# Patient Record
Sex: Male | Born: 1980 | Race: Black or African American | Hispanic: No | Marital: Single | Smoking: Never smoker
Health system: Southern US, Community
[De-identification: ages and names within clinical notes are randomized; demographics above are authoritative.]

## PROBLEM LIST (undated history)

## (undated) DIAGNOSIS — K611 Rectal abscess: Secondary | ICD-10-CM

## (undated) HISTORY — PX: HERNIA REPAIR: SHX51

---
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
## 2002-01-21 ENCOUNTER — Encounter: Admission: RE | Admit: 2002-01-21 | Discharge: 2002-01-21 | Payer: Self-pay | Admitting: Psychiatry

## 2002-03-11 ENCOUNTER — Encounter: Admission: RE | Admit: 2002-03-11 | Discharge: 2002-03-11 | Payer: Self-pay | Admitting: Psychiatry

## 2002-04-22 ENCOUNTER — Encounter: Admission: RE | Admit: 2002-04-22 | Discharge: 2002-04-22 | Payer: Self-pay | Admitting: Psychiatry

## 2002-06-02 ENCOUNTER — Encounter: Admission: RE | Admit: 2002-06-02 | Discharge: 2002-06-02 | Payer: Self-pay | Admitting: Psychiatry

## 2002-07-14 ENCOUNTER — Encounter: Admission: RE | Admit: 2002-07-14 | Discharge: 2002-07-14 | Payer: Self-pay | Admitting: Psychiatry

## 2002-08-27 ENCOUNTER — Encounter: Admission: RE | Admit: 2002-08-27 | Discharge: 2002-08-27 | Payer: Self-pay | Admitting: Psychiatry

## 2002-12-07 ENCOUNTER — Emergency Department (HOSPITAL_COMMUNITY): Admission: AD | Admit: 2002-12-07 | Discharge: 2002-12-07 | Payer: Self-pay | Admitting: Emergency Medicine

## 2002-12-08 ENCOUNTER — Encounter: Payer: Self-pay | Admitting: Emergency Medicine

## 2004-01-01 ENCOUNTER — Ambulatory Visit (HOSPITAL_COMMUNITY): Payer: Self-pay | Admitting: Psychiatry

## 2004-04-04 ENCOUNTER — Ambulatory Visit (HOSPITAL_COMMUNITY): Payer: Self-pay | Admitting: Psychiatry

## 2004-07-04 ENCOUNTER — Ambulatory Visit (HOSPITAL_COMMUNITY): Payer: Self-pay | Admitting: Psychiatry

## 2004-10-03 ENCOUNTER — Ambulatory Visit (HOSPITAL_COMMUNITY): Payer: Self-pay | Admitting: Psychiatry

## 2005-02-15 ENCOUNTER — Ambulatory Visit (HOSPITAL_COMMUNITY): Payer: Self-pay | Admitting: Psychiatry

## 2005-05-15 ENCOUNTER — Ambulatory Visit (HOSPITAL_COMMUNITY): Payer: Self-pay | Admitting: Psychiatry

## 2005-09-20 ENCOUNTER — Ambulatory Visit (HOSPITAL_COMMUNITY): Payer: Self-pay | Admitting: Psychiatry

## 2005-10-25 ENCOUNTER — Ambulatory Visit: Payer: Self-pay | Admitting: Family Medicine

## 2005-12-18 ENCOUNTER — Ambulatory Visit (HOSPITAL_COMMUNITY): Payer: Self-pay | Admitting: Psychiatry

## 2006-04-04 ENCOUNTER — Ambulatory Visit (HOSPITAL_COMMUNITY): Payer: Self-pay | Admitting: Psychiatry

## 2006-07-11 ENCOUNTER — Ambulatory Visit (HOSPITAL_COMMUNITY): Payer: Self-pay | Admitting: Psychiatry

## 2006-10-19 ENCOUNTER — Ambulatory Visit (HOSPITAL_COMMUNITY): Payer: Self-pay | Admitting: Psychiatry

## 2007-01-18 ENCOUNTER — Ambulatory Visit (HOSPITAL_COMMUNITY): Payer: Self-pay | Admitting: Psychiatry

## 2007-08-13 ENCOUNTER — Emergency Department (HOSPITAL_COMMUNITY): Admission: EM | Admit: 2007-08-13 | Discharge: 2007-08-13 | Payer: Self-pay | Admitting: Emergency Medicine

## 2007-08-14 ENCOUNTER — Telehealth (INDEPENDENT_AMBULATORY_CARE_PROVIDER_SITE_OTHER): Payer: Self-pay | Admitting: *Deleted

## 2008-08-27 ENCOUNTER — Encounter (INDEPENDENT_AMBULATORY_CARE_PROVIDER_SITE_OTHER): Payer: Self-pay | Admitting: Family Medicine

## 2010-11-24 LAB — HEMOGLOBIN AND HEMATOCRIT, BLOOD
HCT: 43
Hemoglobin: 15

## 2010-11-24 LAB — OCCULT BLOOD X 1 CARD TO LAB, STOOL: Fecal Occult Bld: NEGATIVE

## 2011-02-12 ENCOUNTER — Encounter: Payer: Self-pay | Admitting: Emergency Medicine

## 2011-02-12 ENCOUNTER — Emergency Department (HOSPITAL_COMMUNITY)
Admission: EM | Admit: 2011-02-12 | Discharge: 2011-02-12 | Disposition: A | Discharge status: Home / Self Care | Payer: Medicaid Other | Attending: Emergency Medicine | Admitting: Emergency Medicine

## 2011-02-12 DIAGNOSIS — L0231 Cutaneous abscess of buttock: Secondary | ICD-10-CM | POA: Insufficient documentation

## 2011-02-12 DIAGNOSIS — L0390 Cellulitis, unspecified: Secondary | ICD-10-CM

## 2011-02-12 DIAGNOSIS — K6289 Other specified diseases of anus and rectum: Secondary | ICD-10-CM | POA: Insufficient documentation

## 2011-02-12 DIAGNOSIS — L03317 Cellulitis of buttock: Secondary | ICD-10-CM | POA: Insufficient documentation

## 2011-02-12 DIAGNOSIS — IMO0001 Reserved for inherently not codable concepts without codable children: Secondary | ICD-10-CM | POA: Insufficient documentation

## 2011-02-12 MED ORDER — IBUPROFEN 800 MG PO TABS: 800.0000 mg | ORAL_TABLET | Freq: Three times a day (TID) | ORAL | Status: AC

## 2011-02-12 MED ORDER — HYDROMORPHONE HCL PF 2 MG/ML IJ SOLN
2.0000 mg | Freq: Once | INTRAMUSCULAR | Status: AC
  Administered 2011-02-12: 2 mg via INTRAMUSCULAR
  Filled 2011-02-12: qty 1

## 2011-02-12 MED ORDER — SULFAMETHOXAZOLE-TRIMETHOPRIM 800-160 MG PO TABS: 2.0000 | ORAL_TABLET | Freq: Two times a day (BID) | ORAL | Status: AC

## 2011-02-12 NOTE — ED Notes (Signed)
Pt here for swelling to left buttock and swollen area to rectum x 1 week; pt sts thought was hemorrhoid but has not improved

## 2011-02-12 NOTE — ED Notes (Signed)
Patient states that he had onset of pain to his left buttocks and rectal area x 1 week.  Pt states that he thought it was hemrroids, but now thinks it may be a sore.  Patient denies any constipation at this time.  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  Pt states that he now has drainage from area.  Pt states he thinks he has had a fever with same.  Patient has increase in pain when he sits or walks.  Patient is undressing for further examination per MD.

## 2011-02-12 NOTE — ED Provider Notes (Signed)
History     CSN: [id number] Arrival date & time: 02/12/2011  9:11 AM   First MD Initiated Contact with Patient 02/12/11 1014      Chief Complaint  Patient presents with  . Rectal Pain    (Consider location/radiation/quality/duration/timing/severity/associated sxs/prior treatment) HPI Comments: Patient reports pain and swelling in his left buttock x 6 days.  Was using antibiotic cream at first then switched to hemorrhoid cream.  Denies bleeding or discharge.  Denies fevers, abdominal pain, change in bowel habits.    The history is provided by the patient.    History reviewed. No pertinent past medical history.  History reviewed. No pertinent past surgical history.  History reviewed. No pertinent family history.  History  Substance Use Topics  . Smoking status: Never Smoker   . Smokeless tobacco: Not on file  . Alcohol Use: No      Review of Systems  All other systems reviewed and are negative.    Allergies  Review of patient's allergies indicates no known allergies.  Home Medications   Current Outpatient Rx  Name Route Sig Dispense Refill  . PSYLLIUM 95 % PO PACK Oral Take 1 packet by mouth daily.        BP 119/71  Pulse 108  Temp(Src) 98 F (36.7 C) (Oral)  Resp 18  SpO2 99%  Physical Exam  Nursing note and vitals reviewed. Constitutional: He is oriented to person, place, and time. He appears well-developed and well-nourished.  HENT:  Head: Normocephalic and atraumatic.  Neck: Neck supple.  Pulmonary/Chest: Effort normal.  Neurological: He is alert and oriented to person, place, and time.  Skin:       ED Course  Procedures (including critical care time)  Labs Reviewed - No data to display No results found. INCISION AND DRAINAGE Performed by: Rise Patience Consent: Verbal consent obtained. Risks and benefits: risks, benefits and alternatives were discussed Type: abscess  Body area: left buttock   Anesthesia: local infiltration  Local  anesthetic: lidocaine 2% no epinephrine  Anesthetic total: 6 ml  Complexity: complex Blunt dissection to break up loculations  Drainage: purulent  Drainage amount: large  Packing material: 1/4 in iodoform gauze  Patient tolerance: Patient tolerated the procedure well with no immediate complications.     1. Abscess and cellulitis    Medical screening examination/treatment/procedure(s) were performed by non-physician practitioner and as supervising physician I was immediately available for consultation/collaboration. Osvaldo Human, M.D.   MDM  Patient with abscess and surrounding cellulitis, I&D performed in ED.  Pt d/c home on abx with instructions to return in 2 days for recheck and packing removal and reasons for immediate return.          Dillard Cannon [place], [place] 02/12/11 1609  Carleene Cooper III, MD 02/12/11 2030

## 2011-02-12 NOTE — ED Notes (Signed)
PA at bedside.

## 2011-02-12 NOTE — ED Notes (Signed)
PA at pt bedside for abscess drainage.

## 2011-02-14 ENCOUNTER — Encounter (HOSPITAL_COMMUNITY): Payer: Self-pay

## 2011-02-14 ENCOUNTER — Emergency Department (HOSPITAL_COMMUNITY)
Admission: EM | Admit: 2011-02-14 | Discharge: 2011-02-14 | Disposition: A | Discharge status: Home / Self Care | Payer: Medicaid Other | Attending: Emergency Medicine | Admitting: Emergency Medicine

## 2011-02-14 DIAGNOSIS — L03317 Cellulitis of buttock: Secondary | ICD-10-CM | POA: Insufficient documentation

## 2011-02-14 DIAGNOSIS — Z4800 Encounter for change or removal of nonsurgical wound dressing: Secondary | ICD-10-CM | POA: Insufficient documentation

## 2011-02-14 DIAGNOSIS — Z09 Encounter for follow-up examination after completed treatment for conditions other than malignant neoplasm: Secondary | ICD-10-CM | POA: Insufficient documentation

## 2011-02-14 DIAGNOSIS — L0231 Cutaneous abscess of buttock: Secondary | ICD-10-CM | POA: Insufficient documentation

## 2011-02-14 NOTE — ED Notes (Signed)
Pt here for recheck of rectal abscess and removal of packing.  Pt denies pain at this time.

## 2011-02-14 NOTE — ED Provider Notes (Signed)
History    this is a 30 year old male presenting to the ED for an abscess recheck. Patient had an abscess to his left buttock which was recently undergoing an I&D 2 days ago. Patient was given pain medication and antibiotic. Patient states for the past 2 days the pain has improved significantly. He denies fever, nausea, vomiting, increasing pain, rectal pain, or rectal bleeding.  CSN: [id number] Arrival date & time: 02/14/2011  9:16 AM   First MD Initiated Contact with Patient 02/14/11 1117      Chief Complaint  Patient presents with  . Abscess    (Consider location/radiation/quality/duration/timing/severity/associated sxs/prior treatment) HPI  History reviewed. No pertinent past medical history.  History reviewed. No pertinent past surgical history.  History reviewed. No pertinent family history.  History  Substance Use Topics  . Smoking status: Never Smoker   . Smokeless tobacco: Not on file  . Alcohol Use: No      Review of Systems  All other systems reviewed and are negative.    Allergies  Review of patient's allergies indicates no known allergies.  Home Medications   Current Outpatient Rx  Name Route Sig Dispense Refill  . IBUPROFEN 800 MG PO TABS Oral Take 1 tablet (800 mg total) by mouth 3 (three) times daily. 15 tablet 0  . PSYLLIUM 95 % PO PACK Oral Take 1 packet by mouth daily.      . SULFAMETHOXAZOLE-TRIMETHOPRIM 800-160 MG PO TABS Oral Take 2 tablets by mouth 2 (two) times daily. 28 tablet 0    BP 102/49  Pulse 110  Temp(Src) 98.4 F (36.9 C) (Oral)  Resp 16  Ht 6\' 1"  (1.854 m)  Wt 180 lb (81.647 kg)  BMI 23.75 kg/m2  SpO2 98%  Physical Exam  Nursing note and vitals reviewed. Constitutional:       Awake, alert, nontoxic appearance  HENT:  Head: Atraumatic.  Eyes: Right eye exhibits no discharge. Left eye exhibits no discharge.  Neck: Neck supple.  Pulmonary/Chest: Effort normal. He exhibits no tenderness.  Abdominal: There is no  tenderness. There is no rebound.  Musculoskeletal: He exhibits no tenderness.       Baseline ROM, no obvious new focal weakness  Neurological:       Mental status and motor strength appears baseline for patient and situation  Skin: No rash noted.     Psychiatric: He has a normal mood and affect.    ED Course  Procedures (including critical care time)  Labs Reviewed - No data to display No results found.   No diagnosis found.  11:53 AM Packing removed without obvious complication.  Abscess draining appropriately.  No obvious evidence of cellulitis.  No rectal involvement.  MDM  i recommend sitz bath and wound care.  Pt voice understanding.  Pt will continues with his abx.          Fayrene Helper, PA 02/14/11 1154

## 2011-02-14 NOTE — ED Notes (Signed)
Pt. Here for recheck of abscess to buttocks

## 2011-02-17 NOTE — ED Provider Notes (Signed)
Evaluation and management procedures were performed by the PA/NP under my supervision/collaboration.   Felisa Bonier, MD 02/17/11 [phone]

## 2015-01-11 ENCOUNTER — Emergency Department (HOSPITAL_COMMUNITY)
Admission: EM | Admit: 2015-01-11 | Discharge: 2015-01-12 | Disposition: A | Discharge status: Home / Self Care | Payer: Medicaid Other | Attending: Emergency Medicine | Admitting: Emergency Medicine

## 2015-01-11 ENCOUNTER — Encounter (HOSPITAL_COMMUNITY): Payer: Self-pay | Admitting: *Deleted

## 2015-01-11 ENCOUNTER — Emergency Department (HOSPITAL_COMMUNITY): Payer: Medicaid Other

## 2015-01-11 DIAGNOSIS — J029 Acute pharyngitis, unspecified: Secondary | ICD-10-CM | POA: Diagnosis present

## 2015-01-11 DIAGNOSIS — R079 Chest pain, unspecified: Secondary | ICD-10-CM | POA: Insufficient documentation

## 2015-01-11 DIAGNOSIS — B9789 Other viral agents as the cause of diseases classified elsewhere: Secondary | ICD-10-CM

## 2015-01-11 DIAGNOSIS — J069 Acute upper respiratory infection, unspecified: Secondary | ICD-10-CM | POA: Diagnosis not present

## 2015-01-11 LAB — CBC WITH DIFFERENTIAL/PLATELET
BASOS ABS: 0 10*3/uL (ref 0.0–0.1)
Basophils Relative: 0 %
EOS ABS: 0.2 10*3/uL (ref 0.0–0.7)
EOS PCT: 3 %
HCT: 43 % (ref 39.0–52.0)
Hemoglobin: 14.6 g/dL (ref 13.0–17.0)
Lymphocytes Relative: 44 %
Lymphs Abs: 2.9 10*3/uL (ref 0.7–4.0)
MCH: 30.2 pg (ref 26.0–34.0)
MCHC: 34 g/dL (ref 30.0–36.0)
MCV: 88.8 fL (ref 78.0–100.0)
MONO ABS: 0.4 10*3/uL (ref 0.1–1.0)
Monocytes Relative: 7 %
Neutro Abs: 3.1 10*3/uL (ref 1.7–7.7)
Neutrophils Relative %: 46 %
PLATELETS: 261 10*3/uL (ref 150–400)
RBC: 4.84 MIL/uL (ref 4.22–5.81)
RDW: 12.4 % (ref 11.5–15.5)
WBC: 6.6 10*3/uL (ref 4.0–10.5)

## 2015-01-11 LAB — COMPREHENSIVE METABOLIC PANEL
ALBUMIN: 4.1 g/dL (ref 3.5–5.0)
ALT: 71 U/L — ABNORMAL HIGH (ref 17–63)
ANION GAP: 6 (ref 5–15)
AST: 50 U/L — AB (ref 15–41)
Alkaline Phosphatase: 73 U/L (ref 38–126)
BUN: 9 mg/dL (ref 6–20)
CHLORIDE: 102 mmol/L (ref 101–111)
CO2: 28 mmol/L (ref 22–32)
Calcium: 9.9 mg/dL (ref 8.9–10.3)
Creatinine, Ser: 1.17 mg/dL (ref 0.61–1.24)
GFR calc Af Amer: >60 mL/min (ref >60)
GFR calc non Af Amer: >60 mL/min (ref >60)
GLUCOSE: 93 mg/dL (ref 65–99)
POTASSIUM: 4.2 mmol/L (ref 3.5–5.1)
Sodium: 136 mmol/L (ref 135–145)
Total Bilirubin: 0.6 mg/dL (ref 0.3–1.2)
Total Protein: 8 g/dL (ref 6.5–8.1)

## 2015-01-11 LAB — MONONUCLEOSIS SCREEN: MONO SCREEN: NEGATIVE

## 2015-01-11 MED ORDER — PREDNISONE 20 MG PO TABS: 40.0000 mg | ORAL_TABLET | Freq: Every day | ORAL | Status: DC

## 2015-01-11 MED ORDER — NAPROXEN 500 MG PO TABS: 500.0000 mg | ORAL_TABLET | Freq: Two times a day (BID) | ORAL | Status: DC

## 2015-01-11 MED ORDER — NAPROXEN 250 MG PO TABS
500.0000 mg | ORAL_TABLET | Freq: Once | ORAL | Status: AC
  Administered 2015-01-11: 500 mg via ORAL
  Filled 2015-01-11: qty 2

## 2015-01-11 MED ORDER — CETIRIZINE-PSEUDOEPHEDRINE ER 5-120 MG PO TB12: 1.0000 | ORAL_TABLET | Freq: Two times a day (BID) | ORAL | Status: DC

## 2015-01-11 MED ORDER — LORATADINE 10 MG PO TABS
10.0000 mg | ORAL_TABLET | Freq: Once | ORAL | Status: AC
  Administered 2015-01-11: 10 mg via ORAL
  Filled 2015-01-11: qty 1

## 2015-01-11 MED ORDER — BENZONATATE 100 MG PO CAPS: 100.0000 mg | ORAL_CAPSULE | Freq: Three times a day (TID) | ORAL | Status: DC | PRN

## 2015-01-11 MED ORDER — PREDNISONE 20 MG PO TABS
60.0000 mg | ORAL_TABLET | Freq: Once | ORAL | Status: AC
  Administered 2015-01-11: 60 mg via ORAL
  Filled 2015-01-11: qty 3

## 2015-01-11 NOTE — ED Provider Notes (Signed)
CSN: [id number]     Arrival date & time 01/11/15  1807 History   First MD Initiated Contact with Patient 01/11/15 2141     Chief Complaint  Patient presents with  . Sore Throat  . Abnormal Lab     (Consider location/radiation/quality/duration/timing/severity/associated sxs/prior Treatment) HPI Comments: Patient is a 34 year old male who presents to the emergency department for further evaluation of upper respiratory symptoms. Patient reports that he had "fluid in the throat" which felt uncomfortable. Patient went to see his primary care doctor for this who prescribed him amoxicillin. Patient completed his amoxicillin course on Saturday and began feeling his symptoms return the following day. Patient reports a cough productive of phlegm with the sensation of worsening postnasal drip. He has had nasal congestion and rhinorrhea as well as myalgias. He also reports developing chest pain which is worse with deep breathing. He has been taking a cough medicine for symptoms without relief. Patient does state that some of his family members have been sick with "a cold". Patient was notified by his primary care doctor that he had elevated LFTs. He has a follow-up appointment with them in 2 days. Patient has had no fever, nausea, vomiting, urinary symptoms, abdominal pain, or travel outside of the state or country. He reports that he still has his gallbladder. He denies alcohol use or history of  IV drug use.  PCP - [facility]  Patient is a 34 y.o. male presenting with pharyngitis. The history is provided by the patient. No language interpreter was used.  Sore Throat Associated symptoms include chest pain and congestion. Pertinent negatives include no fever, nausea or vomiting.    History reviewed. No pertinent past medical history. History reviewed. No pertinent past surgical history. History reviewed. No pertinent family history. Social History  Substance Use Topics  . Smoking status:  Never Smoker   . Smokeless tobacco: None  . Alcohol Use: No    Review of Systems  Constitutional: Negative for fever.  HENT: Positive for congestion, postnasal drip and rhinorrhea. Negative for drooling and trouble swallowing.   Respiratory: Negative for shortness of breath.   Cardiovascular: Positive for chest pain.  Gastrointestinal: Negative for nausea and vomiting.  Genitourinary: Negative for dysuria and hematuria.  All other systems reviewed and are negative.   Allergies  Review of patient's allergies indicates no known allergies.  Home Medications   Prior to Admission medications   Medication Sig Start Date End Date Taking? Authorizing Provider  Phenylephrine-Chlorphen-DM (NOHIST-DM PO) Take 5 mLs by mouth every 4 (four) hours.   Yes Historical Provider, MD  benzonatate (TESSALON) 100 MG capsule Take 1 capsule (100 mg total) by mouth 3 (three) times daily as needed for cough. 01/11/15   Antony Madura, PA-C  cetirizine-pseudoephedrine (ZYRTEC-D) 5-120 MG tablet Take 1 tablet by mouth 2 (two) times daily. 01/11/15   Antony Madura, PA-C  naproxen (NAPROSYN) 500 MG tablet Take 1 tablet (500 mg total) by mouth 2 (two) times daily. 01/11/15   Antony Madura, PA-C  predniSONE (DELTASONE) 20 MG tablet Take 2 tablets (40 mg total) by mouth daily. 01/11/15   Antony Madura, PA-C   BP 109/73 mmHg  Pulse 64  Temp(Src) 99.5 F (37.5 C) (Oral)  Resp 16  SpO2 98%   Physical Exam  Constitutional: He is oriented to person, place, and time. He appears well-developed and well-nourished. No distress.  Nontoxic/nonseptic appearing  HENT:  Head: Normocephalic and atraumatic.  Mouth/Throat: No oropharyngeal exudate.  Mild posterior oropharyngeal erythema. No  edema or palatal petechiae. No exudates.  Eyes: Conjunctivae and EOM are normal. No scleral icterus.  Neck: Normal range of motion.  No nuchal rigidity or meningismus  Cardiovascular: Normal rate, regular rhythm and intact distal pulses.    Pulmonary/Chest: Effort normal. No respiratory distress. He has no wheezes. He has no rales.  Discomfort with inspiration noted. Chest expansion symmetric. No wheezes or rales. No tachypnea or dyspnea.  Musculoskeletal: Normal range of motion.  Neurological: He is alert and oriented to person, place, and time. He exhibits normal muscle tone. Coordination normal.  Skin: Skin is warm and dry. No rash noted. He is not diaphoretic. No erythema. No pallor.  Psychiatric: He has a normal mood and affect. His behavior is normal.  Nursing note and vitals reviewed.   ED Course  Procedures (including critical care time) Labs Review Labs Reviewed  COMPREHENSIVE METABOLIC PANEL - Abnormal; Notable for the following:    AST 50 (*)    ALT 71 (*)    All other components within normal limits  CBC WITH DIFFERENTIAL/PLATELET  MONONUCLEOSIS SCREEN    Imaging Review Dg Chest 2 View  01/11/2015  CLINICAL DATA:  Cough and congestion for 4 days. EXAM: CHEST  2 VIEW COMPARISON:  None. FINDINGS: The cardiomediastinal contours are normal. The lungs are clear. Pulmonary vasculature is normal. No consolidation, pleural effusion, or pneumothorax. No acute osseous abnormalities are seen. IMPRESSION: No acute pulmonary process. Electronically Signed   By: Rubye OaksMelanie  Ehinger M.D.   On: 01/11/2015 22:36     I have personally reviewed and evaluated these images and lab results as part of my medical decision-making.   EKG Interpretation None      MDM   Final diagnoses:  Viral URI with cough    34 year old male resents to the emergency department for evaluation of nasal congestion, rhinorrhea, body aches, and cough productive of phlegm. Patient's symptoms are consistent with URI, likely viral etiology. Pt CXR negative for acute infiltrate. No leukocytosis noted. Discussed that antibiotics are not indicated for viral infections. Pt will be discharged with symptomatic treatment. He is also concerned over elevated  LFTs, but today's results are only slightly above baseline. No fever, vomiting, diarrhea, or abdominal pain. No ETOH or IVDU. Negative monospot. Dot not believe further emergent w/u is indicated for this at this time. PCP is well aware of these results and patient has f/u with his PCP in 2 days. Patient, therefore, stable for discharge. Patient verbalizes understanding and is agreeable with plan. Patient discharged in good condition with no unaddressed concerns.   Filed Vitals:   01/11/15 2245 01/11/15 2300 01/11/15 2302 01/11/15 2315  BP:   121/74 109/73  Pulse: 66 72 70 64  Temp:      TempSrc:      Resp:      SpO2: 96% 99% 94% 98%       Antony MaduraKelly Zaron Zwiefelhofer, PA-C 01/11/15 2347  Gerhard Munchobert Lockwood, MD 01/12/15 [phone]

## 2015-01-11 NOTE — Discharge Instructions (Signed)
Take prednisone and Zyrtec D as prescribed. You may take Tessalon for cough and naproxen for body aches and chest pain. Follow-up with your primary care doctor in 2 days.  Upper Respiratory Infection, Adult Most upper respiratory infections (URIs) are a viral infection of the air passages leading to the lungs. A URI affects the nose, throat, and upper air passages. The most common type of URI is nasopharyngitis and is typically referred to as "the common cold." URIs run their course and usually go away on their own. Most of the time, a URI does not require medical attention, but sometimes a bacterial infection in the upper airways can follow a viral infection. This is called a secondary infection. Sinus and middle ear infections are common types of secondary upper respiratory infections. Bacterial pneumonia can also complicate a URI. A URI can worsen asthma and chronic obstructive pulmonary disease (COPD). Sometimes, these complications can require emergency medical care and may be life threatening.  CAUSES Almost all URIs are caused by viruses. A virus is a type of germ and can spread from one person to another.  RISKS FACTORS You may be at risk for a URI if:   You smoke.   You have chronic heart or lung disease.  You have a weakened defense (immune) system.   You are very young or very old.   You have nasal allergies or asthma.  You work in crowded or poorly ventilated areas.  You work in health care facilities or schools. SIGNS AND SYMPTOMS  Symptoms typically develop 2-3 days after you come in contact with a cold virus. Most viral URIs last 7-10 days. However, viral URIs from the influenza virus (flu virus) can last 14-18 days and are typically more severe. Symptoms may include:   Runny or stuffy (congested) nose.   Sneezing.   Cough.   Sore throat.   Headache.   Fatigue.   Fever.   Loss of appetite.   Pain in your forehead, behind your eyes, and over your  cheekbones (sinus pain).  Muscle aches.  DIAGNOSIS  Your health care provider may diagnose a URI by:  Physical exam.  Tests to check that your symptoms are not due to another condition such as:  Strep throat.  Sinusitis.  Pneumonia.  Asthma. TREATMENT  A URI goes away on its own with time. It cannot be cured with medicines, but medicines may be prescribed or recommended to relieve symptoms. Medicines may help:  Reduce your fever.  Reduce your cough.  Relieve nasal congestion. HOME CARE INSTRUCTIONS   Take medicines only as directed by your health care provider.   Gargle warm saltwater or take cough drops to comfort your throat as directed by your health care provider.  Use a warm mist humidifier or inhale steam from a shower to increase air moisture. This may make it easier to breathe.  Drink enough fluid to keep your urine clear or pale yellow.   Eat soups and other clear broths and maintain good nutrition.   Rest as needed.   Return to work when your temperature has returned to normal or as your health care provider advises. You may need to stay home longer to avoid infecting others. You can also use a face mask and careful hand washing to prevent spread of the virus.  Increase the usage of your inhaler if you have asthma.   Do not use any tobacco products, including cigarettes, chewing tobacco, or electronic cigarettes. If you need help quitting, ask your  health care provider. PREVENTION  The best way to protect yourself from getting a cold is to practice good hygiene.   Avoid oral or hand contact with people with cold symptoms.   Wash your hands often if contact occurs.  There is no clear evidence that vitamin C, vitamin E, echinacea, or exercise reduces the chance of developing a cold. However, it is always recommended to get plenty of rest, exercise, and practice good nutrition.  SEEK MEDICAL CARE IF:   You are getting worse rather than better.    Your symptoms are not controlled by medicine.   You have chills.  You have worsening shortness of breath.  You have brown or red mucus.  You have yellow or brown nasal discharge.  You have pain in your face, especially when you bend forward.  You have a fever.  You have swollen neck glands.  You have pain while swallowing.  You have white areas in the back of your throat. SEEK IMMEDIATE MEDICAL CARE IF:   You have severe or persistent:  Headache.  Ear pain.  Sinus pain.  Chest pain.  You have chronic lung disease and any of the following:  Wheezing.  Prolonged cough.  Coughing up blood.  A change in your usual mucus.  You have a stiff neck.  You have changes in your:  Vision.  Hearing.  Thinking.  Mood. MAKE SURE YOU:   Understand these instructions.  Will watch your condition.  Will get help right away if you are not doing well or get worse.   This information is not intended to replace advice given to you by your health care provider. Make sure you discuss any questions you have with your health care provider.   Document Released: 08/09/2000 Document Revised: 06/30/2014 Document Reviewed: 05/21/2013 Elsevier Interactive Patient Education 2016 [employer] Inc[employer]  Cough, Adult Coughing is a reflex that clears your throat and your airways. Coughing helps to heal and protect your lungs. It is normal to cough occasionally, but a cough that happens with other symptoms or lasts a long time may be a sign of a condition that needs treatment. A cough may last only 2-3 weeks (acute), or it may last longer than 8 weeks (chronic). CAUSES Coughing is commonly caused by:  Breathing in substances that irritate your lungs.  A viral or bacterial respiratory infection.  Allergies.  Asthma.  Postnasal drip.  Smoking.  Acid backing up from the stomach into the esophagus (gastroesophageal reflux).  Certain medicines.  Chronic lung problems,  including COPD (or rarely, lung cancer).  Other medical conditions such as heart failure. HOME CARE INSTRUCTIONS  Pay attention to any changes in your symptoms. Take these actions to help with your discomfort:  Take medicines only as told by your health care provider.  If you were prescribed an antibiotic medicine, take it as told by your health care provider. Do not stop taking the antibiotic even if you start to feel better.  Talk with your health care provider before you take a cough suppressant medicine.  Drink enough fluid to keep your urine clear or pale yellow.  If the air is dry, use a cold steam vaporizer or humidifier in your bedroom or your home to help loosen secretions.  Avoid anything that causes you to cough at work or at home.  If your cough is worse at night, try sleeping in a semi-upright position.  Avoid cigarette smoke. If you smoke, quit smoking. If you need help quitting, ask your  health care provider.  Avoid caffeine.  Avoid alcohol.  Rest as needed. SEEK MEDICAL CARE IF:   You have new symptoms.  You cough up pus.  Your cough does not get better after 2-3 weeks, or your cough gets worse.  You cannot control your cough with suppressant medicines and you are losing sleep.  You develop pain that is getting worse or pain that is not controlled with pain medicines.  You have a fever.  You have unexplained weight loss.  You have night sweats. SEEK IMMEDIATE MEDICAL CARE IF:  You cough up blood.  You have difficulty breathing.  Your heartbeat is very fast.   This information is not intended to replace advice given to you by your health care provider. Make sure you discuss any questions you have with your health care provider.   Document Released: 08/12/2010 Document Revised: 11/04/2014 Document Reviewed: 04/22/2014 Elsevier Interactive Patient Education 2016 [employer].  [employer] Vaporizers Vaporizers may help relieve the symptoms of a  cough and cold. They add moisture to the air, which helps mucus to become thinner and less sticky. This makes it easier to breathe and cough up secretions. Cool mist vaporizers do not cause serious burns like hot mist vaporizers, which may also be called steamers or humidifiers. Vaporizers have not been proven to help with colds. You should not use a vaporizer if you are allergic to mold. HOME CARE INSTRUCTIONS  Follow the package instructions for the vaporizer.  Do not use anything other than distilled water in the vaporizer.  Do not run the vaporizer all of the time. This can cause mold or bacteria to grow in the vaporizer.  Clean the vaporizer after each time it is used.  Clean and dry the vaporizer well before storing it.  Stop using the vaporizer if worsening respiratory symptoms develop.   This information is not intended to replace advice given to you by your health care provider. Make sure you discuss any questions you have with your health care provider.   Document Released: 11/11/2003 Document Revised: 02/18/2013 Document Reviewed: 07/03/2012 Elsevier Interactive Patient Education [employer]

## 2015-01-11 NOTE — ED Notes (Addendum)
Pt recently dx with strep throat, completed antibiotic on Saturday and pain returned on Sunday, also called and told he had abnormal liver enzymes from his PCP and advised to come here, pt states he feels worse than before. Pt reports chest and throat congestion and drainage

## 2015-01-11 NOTE — ED Notes (Signed)
Pt stable, ambulatory, states understanding of discharge instructions 

## 2016-05-19 ENCOUNTER — Encounter (HOSPITAL_COMMUNITY): Payer: Self-pay | Admitting: *Deleted

## 2016-05-19 ENCOUNTER — Emergency Department (HOSPITAL_COMMUNITY)
Admission: EM | Admit: 2016-05-19 | Discharge: 2016-05-19 | Disposition: A | Discharge status: Home / Self Care | Payer: Medicaid Other | Attending: Emergency Medicine | Admitting: Emergency Medicine

## 2016-05-19 DIAGNOSIS — B9789 Other viral agents as the cause of diseases classified elsewhere: Secondary | ICD-10-CM

## 2016-05-19 DIAGNOSIS — J069 Acute upper respiratory infection, unspecified: Secondary | ICD-10-CM | POA: Insufficient documentation

## 2016-05-19 DIAGNOSIS — R05 Cough: Secondary | ICD-10-CM | POA: Diagnosis present

## 2016-05-19 MED ORDER — OXYMETAZOLINE HCL 0.05 % NA SOLN: 1.0000 | Freq: Two times a day (BID) | NASAL | 0 refills | Status: DC

## 2016-05-19 MED ORDER — BENZONATATE 100 MG PO CAPS: 200.0000 mg | ORAL_CAPSULE | Freq: Two times a day (BID) | ORAL | 0 refills | Status: DC | PRN

## 2016-05-19 NOTE — ED Triage Notes (Signed)
Pt reports ongoing cold/congestion symptoms. No relief with nyquil. No acute distress is noted at triage.

## 2016-05-19 NOTE — ED Notes (Signed)
PA aware of vitals prior to discharge

## 2016-05-19 NOTE — Discharge Instructions (Signed)
Take your medications as prescribed as needed for nasal congestion and cough. I recommend continuing to take Tylenol and/or ibuprofen as prescribed over-the-counter, alternating between doses every 3-4 hours as needed for fever or pain relief. Continue taking fluids at home to remain hydrated. Please follow up with a primary care provider from the Resource Guide provided below in 4-5 days as needed. Please return to the Emergency Department if symptoms worsen or new onset of fever, headache, neck stiffness, facial or neck swelling, unable to swallow resulting in drooling, difficulty breathing, coughing up blood, chest pain, abdominal pain, vomiting.

## 2016-05-19 NOTE — ED Provider Notes (Signed)
[facility]-EMERGENCY DEPT Provider Note   CSN: [id number] Arrival date & time: 05/19/16  1207  By signing my name below, I, Bridgette Habermann, attest that this documentation has been prepared under the direction and in the presence of Melburn Hake, [place]. Electronically Signed: Bridgette Habermann, ED Scribe. 05/19/16. 12:50 PM.  History   Chief Complaint Chief Complaint  Patient presents with  . URI     HPI The history is provided by the patient. No language interpreter was used.   HPI Comments: Billy Pace is a 36 y.o. male with no pertinent PMHx, who presents to the Emergency Department complaining of productive cough onset 3-4 days ago. Pt also has associated rhinorrhea, congestion, and sore throat. Pt has mild chest pain secondary to his cough. Pt has tried Nyquil and Benadryl with no relief to his symptoms. No known sick contacts with similar symptoms.  Pt denies fever, chills, headache, trismus, drooling, facial swelling, SOB, wheezing, hemoptysis, nausea, vomiting, abdominal pain, ear pain, rash, any other associated symptoms.  History reviewed. No pertinent past medical history.  There are no active problems to display for this patient.   History reviewed. No pertinent surgical history.     Home Medications    Prior to Admission medications   Medication Sig Start Date End Date Taking? Authorizing Provider  benzonatate (TESSALON) 100 MG capsule Take 2 capsules (200 mg total) by mouth 2 (two) times daily as needed for cough. 05/19/16   Barrett Henle, PA-C  oxymetazoline (AFRIN NASAL SPRAY) 0.05 % nasal spray Place 1 spray into both nostrils 2 (two) times daily. Spray once into each nostril twice daily for up to the next 3 days. Do not use for more than 3 days to prevent rebound rhinorrhea. 05/19/16   Barrett Henle, PA-C    Family History History reviewed. No pertinent family history.  Social History Social History  Substance Use Topics  . Smoking status: Never  Smoker  . Smokeless tobacco: Not on file  . Alcohol use No     Allergies   Patient has no known allergies.   Review of Systems Review of Systems  Constitutional: Negative for chills and fever.  HENT: Positive for congestion, rhinorrhea and sore throat. Negative for ear pain.   Respiratory: Positive for cough.   Cardiovascular: Positive for chest pain.  Gastrointestinal: Negative for abdominal pain, nausea and vomiting.  Neurological: Negative for headaches.     Physical Exam Updated Vital Signs BP 126/86 (BP Location: Left Arm)   Pulse 100   Temp 97.6 F (36.4 C) (Oral)   Resp 18   SpO2 100%   Physical Exam  Constitutional: He is oriented to person, place, and time. He appears well-developed and well-nourished.  HENT:  Head: Normocephalic and atraumatic.  Right Ear: Tympanic membrane normal.  Left Ear: Tympanic membrane normal.  Nose: Rhinorrhea present. Right sinus exhibits no maxillary sinus tenderness and no frontal sinus tenderness. Left sinus exhibits no maxillary sinus tenderness and no frontal sinus tenderness.  Mouth/Throat: Uvula is midline, oropharynx is clear and moist and mucous membranes are normal. No oropharyngeal exudate, posterior oropharyngeal edema, posterior oropharyngeal erythema or tonsillar abscesses. No tonsillar exudate.  Eyes: Conjunctivae and EOM are normal. Pupils are equal, round, and reactive to light. Right eye exhibits no discharge. Left eye exhibits no discharge. No scleral icterus.  Neck: Normal range of motion. Neck supple.  Cardiovascular: Normal rate, regular rhythm, normal heart sounds and intact distal pulses.   Pulmonary/Chest: Effort normal and breath sounds  normal. No respiratory distress. He has no wheezes. He has no rales. He exhibits no tenderness.  Abdominal: Soft. Bowel sounds are normal. He exhibits no distension and no mass. There is no tenderness. There is no rebound and no guarding. No hernia.  Musculoskeletal: Normal range  of motion. He exhibits no edema.  Lymphadenopathy:    He has no cervical adenopathy.  Neurological: He is alert and oriented to person, place, and time.  Skin: Skin is warm and dry.  Psychiatric: He has a normal mood and affect. His behavior is normal.  Nursing note and vitals reviewed.    ED Treatments / Results  DIAGNOSTIC STUDIES: Oxygen Saturation is 100% on RA, normal  by my interpretation.    COORDINATION OF CARE: 12:50 PM Discussed treatment plan with pt at bedside and pt agreed to plan.  Labs (all labs ordered are listed, but only abnormal results are displayed) Labs Reviewed - No data to display  EKG  EKG Interpretation None       Radiology No results found.  Procedures Procedures (including critical care time)  Medications Ordered in ED Medications - No data to display   Initial Impression / Assessment and Plan / ED Course  I have reviewed the triage vital signs and the nursing notes.  Pertinent labs & imaging results that were available during my care of the patient were reviewed by me and considered in my medical decision making (see chart for details).     Patients symptoms are consistent with URI, likely viral etiology. VSS. Lungs CTAB. MMM. RRR. No sinus tenderness. Clear oropharynx. No abdominal tenderness. Discussed that antibiotics are not indicated for viral infections. Pt will be discharged with symptomatic treatment.  Verbalizes understanding and is agreeable with plan. Pt is hemodynamically stable & in NAD prior to dc. Discussed return precautions.   Final Clinical Impressions(s) / ED Diagnoses   Final diagnoses:  Viral URI with cough    New Prescriptions New Prescriptions   BENZONATATE (TESSALON) 100 MG CAPSULE    Take 2 capsules (200 mg total) by mouth 2 (two) times daily as needed for cough.   OXYMETAZOLINE (AFRIN NASAL SPRAY) 0.05 % NASAL SPRAY    Place 1 spray into both nostrils 2 (two) times daily. Spray once into each nostril twice  daily for up to the next 3 days. Do not use for more than 3 days to prevent rebound rhinorrhea.   I personally performed the services described in this documentation, which was scribed in my presence. The recorded information has been reviewed and is accurate.     Satira Sarkicole [place], [place]-C 05/19/16 1317    Melene Planan Floyd, DO 05/19/16 [phone]

## 2016-05-22 ENCOUNTER — Encounter (HOSPITAL_COMMUNITY): Payer: Self-pay | Admitting: *Deleted

## 2016-05-30 ENCOUNTER — Ambulatory Visit (INDEPENDENT_AMBULATORY_CARE_PROVIDER_SITE_OTHER): Payer: Medicaid Other | Admitting: Physician Assistant

## 2016-05-30 ENCOUNTER — Encounter (INDEPENDENT_AMBULATORY_CARE_PROVIDER_SITE_OTHER): Payer: Self-pay | Admitting: Physician Assistant

## 2016-05-30 VITALS — BP 111/72 | HR 82 | Temp 97.9°F | Ht 73.39 in | Wt 256.0 lb

## 2016-05-30 DIAGNOSIS — R0981 Nasal congestion: Secondary | ICD-10-CM | POA: Diagnosis not present

## 2016-05-30 DIAGNOSIS — R05 Cough: Secondary | ICD-10-CM | POA: Diagnosis not present

## 2016-05-30 DIAGNOSIS — R059 Cough, unspecified: Secondary | ICD-10-CM

## 2016-05-30 MED ORDER — LORATADINE 10 MG PO TABS: 10.0000 mg | ORAL_TABLET | Freq: Every day | ORAL | 1 refills | Status: DC

## 2016-05-30 MED ORDER — PREDNISONE 20 MG PO TABS: 40.0000 mg | ORAL_TABLET | Freq: Every day | ORAL | 0 refills | Status: DC

## 2016-05-30 MED ORDER — BENZONATATE 200 MG PO CAPS: 200.0000 mg | ORAL_CAPSULE | Freq: Two times a day (BID) | ORAL | 0 refills | Status: DC | PRN

## 2016-05-30 MED ORDER — DIPHENHYDRAMINE HCL 25 MG PO TABS: 25.0000 mg | ORAL_TABLET | Freq: Every evening | ORAL | 0 refills | Status: DC | PRN

## 2016-05-30 NOTE — Patient Instructions (Signed)
Allergies, Adult An allergy is when your body's defense system (immune system) overreacts to an otherwise harmless substance (allergen) that you breathe in or eat or something that touches your skin. When you come into contact with something that you are allergic to, your immune system produces certain proteins (antibodies). These proteins cause cells to release chemicals (histamines) that trigger the symptoms of an allergic reaction. Allergies often affect the nasal passages (allergic rhinitis), eyes (allergic conjunctivitis), skin (atopic dermatitis), and stomach. Allergies can be mild or severe. Allergies cannot spread from person to person (are not contagious). They can develop at any age and may be outgrown. What are the causes? Allergies can be caused by any substance that your immune system mistakenly targets as harmful. These may include:  Outdoor allergens, such as pollen, grass, weeds, car exhaust, and mold spores.  Indoor allergens, such as dust, smoke, mold, and pet dander.  Foods, especially peanuts, milk, eggs, fish, shellfish, soy, nuts, and wheat.  Medicines, such as penicillin.  Skin irritants, such as detergents, chemicals, and latex.  Perfume.  Insect bites or stings. What increases the risk? You may be at greater risk of allergies if other people in your family have allergies. What are the signs or symptoms? Symptoms depend on what type of allergy you have. They may include:  Runny, stuffy nose.  Sneezing.  Itchy mouth, ears, or throat.  Postnasal drip.  Sore throat.  Itchy, red, watery, or puffy eyes.  Skin rash or hives.  Stomach pain.  Vomiting.  Diarrhea.  Bloating.  Wheezing or coughing. People with a severe allergy to food, medicine, or an insect bite may have a life-threatening allergic reaction (anaphylaxis). Symptoms of anaphylaxis include:  Hives.  Itching.  Flushed face.  Swollen lips, tongue, or mouth.  Tight or swollen  throat.  Chest pain or tightness in the chest.  Trouble breathing or shortness of breath.  Rapid heartbeat.  Dizziness or fainting.  Vomiting.  Diarrhea.  Pain in the abdomen. How is this diagnosed? This condition is diagnosed based on:  Your symptoms.  Your family and medical history.  A physical exam. You may need to see a health care provider who specializes in treating allergies (allergist). You may also have tests, including:  Skin tests to see which allergens are causing your symptoms, such as:  Skin prick test. In this test, your skin is pricked with a tiny needle and exposed to small amounts of possible allergens to see if your skin reacts.  Intradermal skin test. In this test, a small amount of allergen is injected under your skin to see if your skin reacts.  Patch test. In this test, a small amount of allergen is placed on your skin and then your skin is covered with a bandage. Your health care provider will check your skin after a couple of days to see if a rash has developed.  Blood tests.  Challenges tests. In this test, you inhale a small amount of allergen by mouth to see if you have an allergic reaction. You may also be asked to:  Keep a food diary. A food diary is a record of all the foods and drinks you have in a day and any symptoms you experience.  Practice an elimination diet. An elimination diet involves eliminating specific foods from your diet and then adding them back in one by one to find out if a certain food causes an allergic reaction. How is this treated? Treatment for allergies depends on your symptoms.   Treatment may include:  Cold compresses to soothe itching and swelling.  Eye drops.  Nasal sprays.  Using a saline spray or container (neti pot) to flush out the nose (nasal irrigation). These methods can help clear away mucus and keep the nasal passages moist.  Using a humidifier.  Oral antihistamines or other medicines to block  allergic reaction and inflammation.  Skin creams to treat rashes or itching.  Diet changes to eliminate food allergy triggers.  Repeated exposure to tiny amounts of allergens to build up a tolerance and prevent future allergic reactions (immunotherapy). These include:  Allergy shots.  Oral treatment. This involves taking small doses of an allergen under the tongue (sublingual immunotherapy).  Emergency epinephrine injection (auto-injector) in case of an allergic emergency. This is a self-injectable, pre-measured medicine that must be given within the first few minutes of a serious allergic reaction. Follow these instructions at home:  Avoid known allergens whenever possible.  If you suffer from airborne allergens, wash out your nose daily. You can do this with a saline spray or a neti pot to flush out your nose (nasal irrigation).  Take over-the-counter and prescription medicines only as told by your health care provider.  Keep all follow-up visits as told by your health care provider. This is important.  If you are at risk of a severe allergic reaction (anaphylaxis), keep your auto-injector with you at all times.  If you have ever had anaphylaxis, wear a medical alert bracelet or necklace that states you have a severe allergy. Contact a health care provider if:  Your symptoms do not improve with treatment. Get help right away if:  You have symptoms of anaphylaxis, such as:  Swollen mouth, tongue, or throat.  Pain or tightness in your chest.  Trouble breathing or shortness of breath.  Dizziness or fainting.  Severe abdominal pain, vomiting, or diarrhea. This information is not intended to replace advice given to you by your health care provider. Make sure you discuss any questions you have with your health care provider. Document Released: 05/09/2002 Document Revised: 10/14/2015 Document Reviewed: 09/01/2015 Elsevier Interactive Patient Education  2017 Elsevier Inc.  

## 2016-05-30 NOTE — Progress Notes (Signed)
Subjective:  Patient ID: Billy Pace, male    DOB: 19-Nov-1980  Age: 36 y.o. MRN: [id number]  CC: f/u ED   HPI Billy Pace is a 37 y.o. male with a PMH of allergic rhinitis presents to f/u from ED visit on 05/19/16, dx with Viral URI. Rx'ed Afrin and Tessalon. Says he is feeling much better. Still has some nasal congestion and mild cough that is worse at night. Denies any other symptoms.   Review of Systems  Constitutional: Negative for chills, fever and malaise/fatigue.  HENT: Positive for congestion.   Respiratory: Positive for cough.   Psychiatric/Behavioral: Negative for depression. The patient is not nervous/anxious.     Objective:  BP 111/72 (BP Location: Right Arm, Patient Position: Sitting, Cuff Size: Normal)   Pulse 82   Temp 97.9 F (36.6 C) (Oral)   Ht 6' 1.39" (1.864 m)   Wt 256 lb (116.1 kg)   SpO2 94%   BMI 33.42 kg/m   BP/Weight 05/30/2016 05/19/2016 01/11/2015  Systolic BP 111 101 122  Diastolic BP 72 54 90  Wt. (Lbs) 256 - -  BMI 33.42 - -      Physical Exam  Constitutional: He is oriented to person, place, and time.  Well developed, overweight, NAD, polite  HENT:  Head: Normocephalic and atraumatic.  Turbinates hypertrophic and pale on right side. TMs normal. Oropharynx unremarkable.  Eyes: Conjunctivae are normal. No scleral icterus.  Neck: Normal range of motion. Neck supple.  Cardiovascular: Normal rate, regular rhythm and normal heart sounds.   Pulmonary/Chest: Effort normal and breath sounds normal.  Lymphadenopathy:    He has no cervical adenopathy.  Neurological: He is alert and oriented to person, place, and time.  Skin: Skin is warm and dry. No rash noted. He is not diaphoretic. No erythema. No pallor.  Psychiatric: He has a normal mood and affect. His behavior is normal. Thought content normal.  Vitals reviewed.    Assessment & Plan:   1. Nasal congestion - Hx of viral URI but seems to have signs of seasonal allergies  also - loratadine (CLARITIN) 10 MG tablet; Take 1 tablet (10 mg total) by mouth daily.  Dispense: 30 tablet; Refill: 1 - diphenhydrAMINE (BENADRYL) 25 MG tablet; Take 1 tablet (25 mg total) by mouth at bedtime as needed.  Dispense: 30 tablet; Refill: 0 - predniSONE (DELTASONE) 20 MG tablet; Take 2 tablets (40 mg total) by mouth daily with breakfast.  Dispense: 10 tablet; Refill: 0  2. Cough - Likely 2/2 postnasal drip - benzonatate (TESSALON) 200 MG capsule; Take 1 capsule (200 mg total) by mouth 2 (two) times daily as needed for cough.  Dispense: 10 capsule; Refill: 0   Meds ordered this encounter  Medications  . loratadine (CLARITIN) 10 MG tablet    Sig: Take 1 tablet (10 mg total) by mouth daily.    Dispense:  30 tablet    Refill:  1    Order Specific Question:   Supervising Provider    Answer:   Quentin Angst [id number]  . diphenhydrAMINE (BENADRYL) 25 MG tablet    Sig: Take 1 tablet (25 mg total) by mouth at bedtime as needed.    Dispense:  30 tablet    Refill:  0    Order Specific Question:   Supervising Provider    Answer:   Quentin Angst [id number]  . predniSONE (DELTASONE) 20 MG tablet    Sig: Take 2 tablets (40 mg total) by mouth daily with  breakfast.    Dispense:  10 tablet    Refill:  0    Order Specific Question:   Supervising Provider    Answer:   Quentin Angst [id number]  . benzonatate (TESSALON) 200 MG capsule    Sig: Take 1 capsule (200 mg total) by mouth 2 (two) times daily as needed for cough.    Dispense:  10 capsule    Refill:  0    Order Specific Question:   Supervising Provider    Answer:   Quentin Angst [[id number]]    Follow-up: Return in about 4 weeks (around 06/27/2016) for full physical.   Loletta Specter PA

## 2017-04-26 ENCOUNTER — Encounter (HOSPITAL_COMMUNITY): Payer: Self-pay | Admitting: Emergency Medicine

## 2017-04-26 ENCOUNTER — Emergency Department (HOSPITAL_COMMUNITY)
Admission: EM | Admit: 2017-04-26 | Discharge: 2017-04-27 | Disposition: A | Discharge status: Home / Self Care | Payer: Medicaid Other | Attending: Emergency Medicine | Admitting: Emergency Medicine

## 2017-04-26 ENCOUNTER — Other Ambulatory Visit: Payer: Self-pay

## 2017-04-26 DIAGNOSIS — Z79899 Other long term (current) drug therapy: Secondary | ICD-10-CM | POA: Insufficient documentation

## 2017-04-26 DIAGNOSIS — H61892 Other specified disorders of left external ear: Secondary | ICD-10-CM | POA: Insufficient documentation

## 2017-04-26 DIAGNOSIS — H938X2 Other specified disorders of left ear: Secondary | ICD-10-CM

## 2017-04-26 NOTE — ED Triage Notes (Signed)
Pt states he just felt that he has a lump on his left ear that he wants to have check.

## 2017-04-27 NOTE — ED Provider Notes (Signed)
[facility] EMERGENCY DEPARTMENT Provider Note   CSN: [id number] Arrival date & time: 04/26/17  2208     History   Chief Complaint Chief Complaint  Patient presents with  . lump behind ear    HPI Milik Gilreath is a 37 y.o. male.  HPI   Mr. Luse is a 37 year old male with no significant past medical history who presents to the emergency department for evaluation of lump behind the left ear.  Patient states that he noticed it earlier today.  The bump is not painful and has not had any drainage.  Denies recent injury to the ear or upper respiratory illness.  He denies fevers, chills, ear pain, ear drainage, hearing loss.   History reviewed. No pertinent past medical history.  There are no active problems to display for this patient.   History reviewed. No pertinent surgical history.     Home Medications    Prior to Admission medications   Medication Sig Start Date End Date Taking? Authorizing Provider  benzonatate (TESSALON) 200 MG capsule Take 1 capsule (200 mg total) by mouth 2 (two) times daily as needed for cough. 05/30/16   Loletta Specter, PA-C  diphenhydrAMINE (BENADRYL) 25 MG tablet Take 1 tablet (25 mg total) by mouth at bedtime as needed. 05/30/16   Loletta Specter, PA-C  loratadine (CLARITIN) 10 MG tablet Take 1 tablet (10 mg total) by mouth daily. 05/30/16   Loletta Specter, PA-C  predniSONE (DELTASONE) 20 MG tablet Take 2 tablets (40 mg total) by mouth daily with breakfast. 05/30/16   Loletta Specter, PA-C    Family History No family history on file.  Social History Social History   Tobacco Use  . Smoking status: Never Smoker  . Smokeless tobacco: Never Used  Substance Use Topics  . Alcohol use: No  . Drug use: No     Allergies   Patient has no known allergies.   Review of Systems Review of Systems  Constitutional: Negative for chills and fever.  HENT: Negative for congestion, ear discharge, ear pain, hearing  loss and sore throat.   Skin: Negative for color change.     Physical Exam Updated Vital Signs BP 135/90 (BP Location: Left Arm)   Pulse 80   Temp (!) 97.4 F (36.3 C) (Oral)   Resp 16   Ht 6\' 2"  (1.88 m)   SpO2 96%   BMI 32.87 kg/m   Physical Exam  Constitutional: He appears well-developed and well-nourished. No distress.  HENT:  Head: Normocephalic and atraumatic.  Approximately 1 cm in diameter soft, mobile nodule noted behind the upper left ear. It is not tender. No erythema, warmth or induration. Bilateral TMs with good cone of light.  Eyes: Conjunctivae are normal. Pupils are equal, round, and reactive to light. Right eye exhibits no discharge. Left eye exhibits no discharge.  Neck: Normal range of motion. Neck supple.  Pulmonary/Chest: Effort normal. No respiratory distress.  Lymphadenopathy:    He has no cervical adenopathy.  Neurological: He is alert. Coordination normal.  Skin: Skin is warm and dry. He is not diaphoretic.  Psychiatric: He has a normal mood and affect. His behavior is normal.  Nursing note and vitals reviewed.    ED Treatments / Results  Labs (all labs ordered are listed, but only abnormal results are displayed) Labs Reviewed - No data to display  EKG  EKG Interpretation None       Radiology No results found.  Procedures Procedures (  including critical care time)  Medications Ordered in ED Medications - No data to display   Initial Impression / Assessment and Plan / ED Course  I have reviewed the triage vital signs and the nursing notes.  Pertinent labs & imaging results that were available during my care of the patient were reviewed by me and considered in my medical decision making (see chart for details).     Presents with lump behind the upper left ear.  Differential includes cyst versus posterior auricular lymphadenopathy.  It is nontender, without warmth or erythema to suggest infection or acute mastoiditis. VSS. Have  counseled patient to follow up with his PCP for further evaluation and management. Have also provided him with information to follow up with ENT. Discussed return precautions with patient who agrees and voices understanding to the above plan. He appears reliable for follow up.  Final Clinical Impressions(s) / ED Diagnoses   Final diagnoses:  Lump of ear, left    ED Discharge Orders    None       Lawrence MarseillesShrosbree, Emily J, PA-C 04/27/17 0139    Tilden Fossaees, Elizabeth, MD 04/27/17 [phone]

## 2017-04-27 NOTE — Discharge Instructions (Signed)
The bump behind her ear appears to be either a cyst or posterior auricular lymph node.   Please use warm compresses behind the ear.  Please follow-up with your regular doctor for further evaluation and management.  I have also listed the information to the ear nose and throat doctor below.  Return to the emergency department if you have fever greater than 100.4 F, ear pain, notice redness behind the ear or have any new or worsening symptoms.

## 2017-07-30 ENCOUNTER — Encounter (HOSPITAL_COMMUNITY): Payer: Self-pay | Admitting: *Deleted

## 2017-07-30 ENCOUNTER — Emergency Department (HOSPITAL_COMMUNITY)
Admission: EM | Admit: 2017-07-30 | Discharge: 2017-07-31 | Disposition: A | Discharge status: Home / Self Care | Payer: Medicaid Other | Attending: Emergency Medicine | Admitting: Emergency Medicine

## 2017-07-30 DIAGNOSIS — L0231 Cutaneous abscess of buttock: Secondary | ICD-10-CM | POA: Diagnosis not present

## 2017-07-30 DIAGNOSIS — K625 Hemorrhage of anus and rectum: Secondary | ICD-10-CM | POA: Diagnosis present

## 2017-07-30 DIAGNOSIS — Z79899 Other long term (current) drug therapy: Secondary | ICD-10-CM | POA: Diagnosis not present

## 2017-07-30 HISTORY — DX: Rectal abscess: K61.1

## 2017-07-30 LAB — CBC WITH DIFFERENTIAL/PLATELET
Abs Immature Granulocytes: 0 10*3/uL (ref 0.0–0.1)
Basophils Absolute: 0 10*3/uL (ref 0.0–0.1)
Basophils Relative: 0 %
Eosinophils Absolute: 0.2 10*3/uL (ref 0.0–0.7)
Eosinophils Relative: 3 %
HEMATOCRIT: 47.8 % (ref 39.0–52.0)
HEMOGLOBIN: 16.1 g/dL (ref 13.0–17.0)
IMMATURE GRANULOCYTES: 0 %
LYMPHS ABS: 2.4 10*3/uL (ref 0.7–4.0)
LYMPHS PCT: 37 %
MCH: 29.9 pg (ref 26.0–34.0)
MCHC: 33.7 g/dL (ref 30.0–36.0)
MCV: 88.8 fL (ref 78.0–100.0)
Monocytes Absolute: 0.5 10*3/uL (ref 0.1–1.0)
Monocytes Relative: 7 %
NEUTROS PCT: 53 %
Neutro Abs: 3.4 10*3/uL (ref 1.7–7.7)
Platelets: 327 10*3/uL (ref 150–400)
RBC: 5.38 MIL/uL (ref 4.22–5.81)
RDW: 12.1 % (ref 11.5–15.5)
WBC: 6.5 10*3/uL (ref 4.0–10.5)

## 2017-07-30 LAB — COMPREHENSIVE METABOLIC PANEL
ALT: 58 U/L (ref 17–63)
AST: 46 U/L — ABNORMAL HIGH (ref 15–41)
Albumin: 3.9 g/dL (ref 3.5–5.0)
Alkaline Phosphatase: 71 U/L (ref 38–126)
Anion gap: 8 (ref 5–15)
BUN: 11 mg/dL (ref 6–20)
CHLORIDE: 109 mmol/L (ref 101–111)
CO2: 23 mmol/L (ref 22–32)
Calcium: 9.6 mg/dL (ref 8.9–10.3)
Creatinine, Ser: 1.23 mg/dL (ref 0.61–1.24)
GFR calc non Af Amer: >60 mL/min (ref >60)
Glucose, Bld: 120 mg/dL — ABNORMAL HIGH (ref 65–99)
POTASSIUM: 4.2 mmol/L (ref 3.5–5.1)
Sodium: 140 mmol/L (ref 135–145)
Total Bilirubin: 0.5 mg/dL (ref 0.3–1.2)
Total Protein: 8.3 g/dL — ABNORMAL HIGH (ref 6.5–8.1)

## 2017-07-30 NOTE — ED Triage Notes (Signed)
Pt in stating he had an abscess inside of his rectum that was opened up 6 years ago, increased bleeding today, denies new pain but reports discomfort to rectal area that is constant

## 2017-07-30 NOTE — ED Provider Notes (Signed)
Patient placed in Quick Look pathway, seen and evaluated   Chief Complaint: rectal bleeding  HPI:   Patient presenting to the ED with complaint of rectal bleeding associated discomfort.  He states he had an abscess lanced that was inside of his rectum, done about 6 to 7 years ago and is requesting recheck.  States he has dark red blood coming from his rectum, not necessarily associated with bowel movements.  No known history of hemorrhoids.  No abdominal complaints.  ROS: + Rectal bleeding  Physical Exam:   Gen: No distress  Neuro: Awake and Alert  Skin: Warm    Focused Exam: Abdomen is soft and nontender.   Initiation of care has begun. The patient has been counseled on the process, plan, and necessity for staying for the completion/evaluation, and the remainder of the medical screening examination    Robinson, [place] N, PA-C 07/30/17 1643    Little, Ambrose Finlandachel Morgan, MD 08/01/17 1440

## 2017-07-31 LAB — POC OCCULT BLOOD, ED: Fecal Occult Bld: NEGATIVE

## 2017-07-31 MED ORDER — SULFAMETHOXAZOLE-TRIMETHOPRIM 800-160 MG PO TABS: 1.0000 | ORAL_TABLET | Freq: Two times a day (BID) | ORAL | 0 refills | Status: AC

## 2017-07-31 NOTE — Discharge Instructions (Addendum)
1. Medications: Bactrim, usual home medications 2. Treatment: rest, drink plenty of fluids, use warm compresses or sitz baths, flush abscess with warm water several times per day and after every bowel movement 3. Follow Up: Please followup with your primary doctor in 2-3 days for discussion of your diagnoses and further evaluation after today's visit; if you do not have a primary care doctor use the resource guide provided to find one; Please return to the ER for fevers, chills, nausea, vomiting or other signs of infection

## 2017-07-31 NOTE — ED Provider Notes (Signed)
[facility] Provider Note   CSN: [id number] Arrival date & time: 07/30/17  1613     History   Chief Complaint Chief Complaint  Patient presents with  . Wound Check    HPI Ernst SpellDon Phillip Greulich is a 37 y.o. male with a hx of rectal abscess presents to the Emergency Department complaining of gradual, persistent, progressively worsening tenderness and discharge from left buttock near his rectum onset 2 days ago.  Pt reports he had a perirectal abscess in 2012.  Record review shows that at that time patient had a left buttock abscess that did not involve his rectum.  Incision and drainage was performed in the emergency department with packing and patient returned 2 days later for wound check.  Patient reports he has had no symptoms since that time.  Patient reports that he has been sitting in a warm bath with Epsom salt multiple times for the last several days.  He reports that last night he put gauze between his buttock and when he awoke there was some blood on it.  Patient denies noting stool on the gauze.  He reports some additional blood today.  Patient reports his last bowel movement was yesterday and was not bloody or painful.  He reports a history of hemorrhoids but states he has not had any trouble with them recently.  Nothing seems to make his symptoms better or worse.  Patient denies fever, chills, headache or neck pain, chest pain, shortness of breath, abdominal pain, nausea, vomiting, diarrhea, weakness, dizziness, syncope, dysuria.    The history is provided by the patient and medical records. No language interpreter was used.    Past Medical History:  Diagnosis Date  . Rectal abscess     There are no active problems to display for this patient.   History reviewed. No pertinent surgical history.      Home Medications    Prior to Admission medications   Medication Sig Start Date End Date Taking? Authorizing Provider  Cholecalciferol  (VITAMIN D PO) Take 1 capsule by mouth daily.   Yes [provider]  diphenhydrAMINE (BENADRYL) 25 MG tablet Take 1 tablet (25 mg total) by mouth at bedtime as needed. 05/30/16  Yes Loletta SpecterGomez, Roger David, PA-C  fluticasone [facility](FLONASE) 50 MCG/ACT nasal spray Place 1 spray into both nostrils 2 (two) times daily. 05/09/17  Yes [provider]  benzonatate (TESSALON) 200 MG capsule Take 1 capsule (200 mg total) by mouth 2 (two) times daily as needed for cough. Patient not taking: Reported on 07/30/2017 05/30/16   Loletta SpecterGomez, Roger David, PA-C  loratadine (CLARITIN) 10 MG tablet Take 1 tablet (10 mg total) by mouth daily. Patient not taking: Reported on 07/30/2017 05/30/16   Loletta SpecterGomez, Roger David, PA-C  sulfamethoxazole-trimethoprim (BACTRIM DS,SEPTRA DS) 800-160 MG tablet Take 1 tablet by mouth 2 (two) times daily for 7 days. 07/31/17 08/07/17  Alfonzo Arca, Dahlia ClientHannah, PA-C    Family History History reviewed. No pertinent family history.  Social History Social History   Tobacco Use  . Smoking status: Never Smoker  . Smokeless tobacco: Never Used  Substance Use Topics  . Alcohol use: No  . Drug use: No     Allergies   Patient has no known allergies.   Review of Systems Review of Systems  Constitutional: Negative for appetite change, diaphoresis, fatigue, fever and unexpected weight change.  HENT: Negative for mouth sores.   Eyes: Negative for visual disturbance.  Respiratory: Negative for cough, chest tightness, shortness  of breath and wheezing.   Cardiovascular: Negative for chest pain.  Gastrointestinal: Negative for abdominal pain, constipation, diarrhea, nausea and vomiting.  Endocrine: Negative for polydipsia, polyphagia and polyuria.  Genitourinary: Negative for dysuria, frequency, hematuria and urgency.  Musculoskeletal: Negative for back pain and neck stiffness.  Skin: Positive for wound. Negative for rash.  Allergic/Immunologic: Negative for immunocompromised state.  Neurological:  Negative for syncope, light-headedness and headaches.  Hematological: Does not bruise/bleed easily.  Psychiatric/Behavioral: Negative for sleep disturbance. The patient is not nervous/anxious.      Physical Exam Updated Vital Signs BP 111/77 (BP Location: Right Arm)   Pulse 70   Temp 98.7 F (37.1 C) (Oral)   Resp 16   SpO2 99%   Physical Exam  Constitutional: He appears well-developed and well-nourished. No distress.  Awake, alert, nontoxic appearance  HENT:  Head: Normocephalic and atraumatic.  Mouth/Throat: Oropharynx is clear and moist. No oropharyngeal exudate.  Eyes: Conjunctivae are normal. No scleral icterus.  Neck: Normal range of motion. Neck supple.  Cardiovascular: Normal rate, regular rhythm and intact distal pulses.  Pulmonary/Chest: Effort normal and breath sounds normal. No respiratory distress. He has no wheezes.  Equal chest expansion  Abdominal: Soft. Bowel sounds are normal. He exhibits no mass. There is no tenderness. There is no rebound and no guarding.    Genitourinary: Prostate normal, testes normal and penis normal. Rectal exam shows no external hemorrhoid, no internal hemorrhoid, no mass, anal tone normal and guaiac negative stool.    Prostate is not enlarged and not tender.  Genitourinary Comments: Chaperone present Ulceration and site of infection does not extend to the rectum.  Rectal exam is nonpainful.  No internal or external hemorrhoids.  Fecal occult is negative.  Brown stool on glove.  Musculoskeletal: Normal range of motion. He exhibits no edema.  Neurological: He is alert.  Speech is clear and goal oriented Moves extremities without ataxia  Skin: Skin is warm and dry. He is not diaphoretic.  Psychiatric: He has a normal mood and affect.  Nursing note and vitals reviewed.    ED Treatments / Results  Labs (all labs ordered are listed, but only abnormal results are displayed) Labs Reviewed  COMPREHENSIVE METABOLIC PANEL - Abnormal;  Notable for the following components:      Result Value   Glucose, Bld 120 (*)    Total Protein 8.3 (*)    AST 46 (*)    All other components within normal limits  CBC WITH DIFFERENTIAL/PLATELET  POC OCCULT BLOOD, ED     Procedures Procedures (including critical care time)  Medications Ordered in ED Medications - No data to display   Initial Impression / Assessment and Plan / ED Course  I have reviewed the triage vital signs and the nursing notes.  Pertinent labs & imaging results that were available during my care of the patient were reviewed by me and considered in my medical decision making (see chart for details).  Clinical Course as of Aug 01 130  Tue Jul 31, 2017  0132 Normal  WBC: 6.5 [HM]  0132 Patient is afebrile without tachycardia or hypotension.  No signs of sepsis.  Temp: 98.7 F (37.1 C) [HM]    Clinical Course User Index [HM] Antrone Walla, Dahlia Client, PA-C    Patient presents with concerns for recurrent rectal abscess.  Record review shows that patient had buttock abscess but did not have rectal abscess.  On exam today he has a small ulceration which appears to have been an abscess  which has ruptured open.  This does not appear to extend deep into the buttock.  No evidence of perirectal fistula on exam.  Wound is draining a small amount of blood but no fecal material.  Area is minimally tender.  Will start on antibiotics and have patient see primary care in 2 days for wound check.  Site does not need I&D at this time as it is draining well and does not here to have persistent fluid collection.  This was discussed with patient and mother who state understanding and are in agreement with the plan.  Final Clinical Impressions(s) / ED Diagnoses   Final diagnoses:  Left buttock abscess    ED Discharge Orders        Ordered    sulfamethoxazole-trimethoprim (BACTRIM DS,SEPTRA DS) 800-160 MG tablet  2 times daily     07/31/17 0126       Toye Rouillard, Dahlia Client,  PA-C 07/31/17 0134    Horton, Mayer Masker, MD 07/31/17 [phone]

## 2017-08-07 ENCOUNTER — Emergency Department (HOSPITAL_COMMUNITY)
Admission: EM | Admit: 2017-08-07 | Discharge: 2017-08-07 | Disposition: A | Discharge status: Home / Self Care | Payer: Medicaid Other | Attending: Emergency Medicine | Admitting: Emergency Medicine

## 2017-08-07 ENCOUNTER — Encounter (HOSPITAL_COMMUNITY): Payer: Self-pay | Admitting: Emergency Medicine

## 2017-08-07 ENCOUNTER — Other Ambulatory Visit: Payer: Self-pay

## 2017-08-07 DIAGNOSIS — K611 Rectal abscess: Secondary | ICD-10-CM | POA: Diagnosis present

## 2017-08-07 DIAGNOSIS — Z79899 Other long term (current) drug therapy: Secondary | ICD-10-CM | POA: Diagnosis not present

## 2017-08-07 DIAGNOSIS — Z5189 Encounter for other specified aftercare: Secondary | ICD-10-CM | POA: Diagnosis not present

## 2017-08-07 NOTE — ED Notes (Signed)
Pt verbalized understanding of discharge instructions and denies any further questions at this time.   

## 2017-08-07 NOTE — Discharge Instructions (Addendum)
You did not have a fever in the ER  Your wound appears to be well healing.   Please return if you have any new or concerning symptoms like new drainage from the bottom, painful bowel movements, worsening abdominal pain, vomiting.

## 2017-08-07 NOTE — ED Provider Notes (Signed)
[facility] EMERGENCY DEPARTMENT Provider Note   CSN: [id number] Arrival date & time: 08/07/17  0907     History   Chief Complaint Chief Complaint  Patient presents with  . Abscess  . Wound Check    HPI Billy Pace is a 37 y.o. male.  HPI  Patient is a 37yo male with a history of rectal abscess who presents to the emergency department for evaluation of measured fever this morning.  Patient reports that he otherwise felt well today, but his mother decided to check his temperature anyways.  She told him that it was high that he needed to come to the emergency department to be evaluated.  He states that he otherwise feels well, denies feeling hot or having chills.  Unsure of why his mother checked his temperature today.  He does say that he has a history of a perirectal abscess in 2012 which returned earlier this month.  He was seen in the ED 6/3 in which he was found to have a draining rectal abscess, at that time he had no pain on rectal exam and there was no concern for deep infection.  He was discharged with Bactrim twice daily for a week which he reports he has taken complete course.  States that he thinks that the draining abscess healed appropriately given it is not bothering him at this time.  He denies cough, congestion, sore throat, chest pain, shortness of breath, abdominal pain, nausea/vomiting, rectal pain, draining sores or wounds, dysuria, urinary frequency.  Past Medical History:  Diagnosis Date  . Rectal abscess     There are no active problems to display for this patient.   History reviewed. No pertinent surgical history.      Home Medications    Prior to Admission medications   Medication Sig Start Date End Date Taking? Authorizing Provider  benzonatate (TESSALON) 200 MG capsule Take 1 capsule (200 mg total) by mouth 2 (two) times daily as needed for cough. Patient not taking: Reported on 07/30/2017 05/30/16   Loletta Specter, PA-C    Cholecalciferol (VITAMIN D PO) Take 1 capsule by mouth daily.    [provider]  diphenhydrAMINE (BENADRYL) 25 MG tablet Take 1 tablet (25 mg total) by mouth at bedtime as needed. 05/30/16   Loletta Specter, PA-C  fluticasone [facility]) 50 MCG/ACT nasal spray Place 1 spray into both nostrils 2 (two) times daily. 05/09/17   [provider]  loratadine (CLARITIN) 10 MG tablet Take 1 tablet (10 mg total) by mouth daily. Patient not taking: Reported on 07/30/2017 05/30/16   Loletta Specter, PA-C  sulfamethoxazole-trimethoprim (BACTRIM DS,SEPTRA DS) 800-160 MG tablet Take 1 tablet by mouth 2 (two) times daily for 7 days. 07/31/17 08/07/17  Muthersbaugh, Dahlia Client, PA-C    Family History No family history on file.  Social History Social History   Tobacco Use  . Smoking status: Never Smoker  . Smokeless tobacco: Never Used  Substance Use Topics  . Alcohol use: No  . Drug use: No     Allergies   Patient has no known allergies.   Review of Systems Review of Systems  Constitutional: Positive for fever (measured fever at home). Negative for chills.  HENT: Negative for congestion and sore throat.   Respiratory: Negative for cough and shortness of breath.   Cardiovascular: Negative for chest pain.  Gastrointestinal: Negative for abdominal pain, blood in stool, nausea and vomiting.  Genitourinary: Negative for dysuria, frequency and hematuria.  Musculoskeletal:  Negative for gait problem.  Skin: Negative for wound.     Physical Exam Updated Vital Signs BP 102/89   Pulse 87   Temp 98 F (36.7 C) (Oral)   Resp 18   Ht 6\' 2"  (1.88 m)   Wt 108.9 kg (240 lb)   SpO2 99%   BMI 30.81 kg/m   Physical Exam  Constitutional: He is oriented to person, place, and time. He appears well-developed and well-nourished. No distress.  Sitting at bedside in no apparent distress, nontoxic-appearing.  HENT:  Head: Normocephalic and atraumatic.  Eyes: Right eye exhibits no discharge.  Left eye exhibits no discharge.  Neck: Normal range of motion. Neck supple.  Cardiovascular: Normal rate, regular rhythm and intact distal pulses.  No murmur heard. Pulmonary/Chest: Effort normal and breath sounds normal. No stridor. No respiratory distress. He has no wheezes. He has no rales.  Abdominal: Soft. There is no tenderness.  Genitourinary:     Genitourinary Comments: Chaperone present for exam. Small ulcer (~.5cm) noted next to the rectum. No overlying tenderness, erythema, warmth, induration or fluctuance. No drainage. Ulcer does not track to the rectum. No gross blood noted on rectal exam, normal tone, no tenderness, no mass or fissure, no hemorrhoids noted.  Neurological: He is alert and oriented to person, place, and time. Coordination normal.  Skin: Skin is warm and dry. Capillary refill takes less than 2 seconds. He is not diaphoretic.  Psychiatric: He has a normal mood and affect. His behavior is normal.  Nursing note and vitals reviewed.    ED Treatments / Results  Labs (all labs ordered are listed, but only abnormal results are displayed) Labs Reviewed - No data to display  EKG None  Radiology No results found.  Procedures Procedures (including critical care time)  Medications Ordered in ED Medications - No data to display   Initial Impression / Assessment and Plan / ED Course  I have reviewed the triage vital signs and the nursing notes.  Pertinent labs & imaging results that were available during my care of the patient were reviewed by me and considered in my medical decision making (see chart for details).     Presents to the emergency department for evaluation of measured fever today, denies any symptoms.  Upon arrival he is afebrile.  Suspect the thermometer at home may not of been working correctly.  He was recently treated with Bactrim for a peri-rectal abscess.  He denies any new pain or drainage from the site.  On exam it appears to be healing  well, no signs of infection.  He was initially tachycardic, but this improved on vital sign recheck.  Given he has no complaints at this time and exam unremarkable, do not think that further labs or work-up is required.  Have counseled him on reasons to return to the emergency department and he agrees.  Final Clinical Impressions(s) / ED Diagnoses   Final diagnoses:  Visit for wound check    ED Discharge Orders    None       Kellie ShropshireShrosbree, Anjanette Gilkey J, PA-C 08/07/17 1743    Charlynne PanderYao, David Hsienta, MD 08/12/17 [phone]

## 2017-08-07 NOTE — ED Triage Notes (Signed)
Pt. Stated iI have a cyst on my rectum that has opened back up.

## 2017-08-07 NOTE — ED Notes (Signed)
Pt states his mother told him to come bc he had a fever. Here he is afebrile. Stats he has completed all antibiotics for the boil. It has drained on its on. Denies pain at the moment.

## 2017-08-15 ENCOUNTER — Other Ambulatory Visit: Payer: Self-pay | Admitting: Physician Assistant

## 2017-08-15 DIAGNOSIS — R945 Abnormal results of liver function studies: Principal | ICD-10-CM

## 2017-08-15 DIAGNOSIS — R7989 Other specified abnormal findings of blood chemistry: Secondary | ICD-10-CM

## 2017-08-28 ENCOUNTER — Other Ambulatory Visit: Payer: Medicaid Other

## 2017-12-14 ENCOUNTER — Ambulatory Visit
Admission: RE | Admit: 2017-12-14 | Discharge: 2017-12-14 | Disposition: A | Discharge status: Home / Self Care | Payer: Medicaid Other | Source: Ambulatory Visit | Attending: Physician Assistant | Admitting: Physician Assistant

## 2017-12-14 DIAGNOSIS — R945 Abnormal results of liver function studies: Principal | ICD-10-CM

## 2017-12-14 DIAGNOSIS — R7989 Other specified abnormal findings of blood chemistry: Secondary | ICD-10-CM

## 2018-09-13 ENCOUNTER — Encounter (HOSPITAL_COMMUNITY): Payer: Self-pay | Admitting: Emergency Medicine

## 2018-09-13 ENCOUNTER — Emergency Department (HOSPITAL_COMMUNITY)
Admission: EM | Admit: 2018-09-13 | Discharge: 2018-09-13 | Disposition: A | Discharge status: Home / Self Care | Payer: Medicaid Other | Attending: Emergency Medicine | Admitting: Emergency Medicine

## 2018-09-13 ENCOUNTER — Emergency Department (HOSPITAL_COMMUNITY): Payer: Medicaid Other

## 2018-09-13 ENCOUNTER — Other Ambulatory Visit: Payer: Self-pay

## 2018-09-13 DIAGNOSIS — R0602 Shortness of breath: Secondary | ICD-10-CM | POA: Insufficient documentation

## 2018-09-13 DIAGNOSIS — Z20828 Contact with and (suspected) exposure to other viral communicable diseases: Secondary | ICD-10-CM | POA: Insufficient documentation

## 2018-09-13 DIAGNOSIS — Z79899 Other long term (current) drug therapy: Secondary | ICD-10-CM | POA: Insufficient documentation

## 2018-09-13 LAB — CBC WITH DIFFERENTIAL/PLATELET
Abs Immature Granulocytes: 0.02 10*3/uL (ref 0.00–0.07)
Basophils Absolute: 0 10*3/uL (ref 0.0–0.1)
Basophils Relative: 1 %
Eosinophils Absolute: 0.2 10*3/uL (ref 0.0–0.5)
Eosinophils Relative: 3 %
HCT: 46 % (ref 39.0–52.0)
Hemoglobin: 15.3 g/dL (ref 13.0–17.0)
Immature Granulocytes: 0 %
Lymphocytes Relative: 37 %
Lymphs Abs: 2.4 10*3/uL (ref 0.7–4.0)
MCH: 30.6 pg (ref 26.0–34.0)
MCHC: 33.3 g/dL (ref 30.0–36.0)
MCV: 92 fL (ref 80.0–100.0)
Monocytes Absolute: 0.7 10*3/uL (ref 0.1–1.0)
Monocytes Relative: 10 %
Neutro Abs: 3.2 10*3/uL (ref 1.7–7.7)
Neutrophils Relative %: 49 %
Platelets: 300 10*3/uL (ref 150–400)
RBC: 5 MIL/uL (ref 4.22–5.81)
RDW: 12.5 % (ref 11.5–15.5)
WBC: 6.5 10*3/uL (ref 4.0–10.5)
nRBC: 0 % (ref 0.0–0.2)

## 2018-09-13 LAB — BASIC METABOLIC PANEL
Anion gap: 8 (ref 5–15)
BUN: 7 mg/dL (ref 6–20)
CO2: 24 mmol/L (ref 22–32)
Calcium: 9.3 mg/dL (ref 8.9–10.3)
Chloride: 105 mmol/L (ref 98–111)
Creatinine, Ser: 1.08 mg/dL (ref 0.61–1.24)
GFR calc Af Amer: >60 mL/min (ref >60)
GFR calc non Af Amer: >60 mL/min (ref >60)
Glucose, Bld: 94 mg/dL (ref 70–99)
Potassium: 3.9 mmol/L (ref 3.5–5.1)
Sodium: 137 mmol/L (ref 135–145)

## 2018-09-13 LAB — D-DIMER, QUANTITATIVE: D-Dimer, Quant: <0.27 ug/mL-FEU (ref 0.00–0.50)

## 2018-09-13 LAB — TROPONIN I (HIGH SENSITIVITY): Troponin I (High Sensitivity): <2 ng/L (ref <18)

## 2018-09-13 MED ORDER — ALBUTEROL SULFATE HFA 108 (90 BASE) MCG/ACT IN AERS
2.0000 | INHALATION_SPRAY | Freq: Once | RESPIRATORY_TRACT | Status: AC
  Administered 2018-09-13: 2 via RESPIRATORY_TRACT
  Filled 2018-09-13: qty 6.7

## 2018-09-13 MED ORDER — AEROCHAMBER PLUS FLO-VU LARGE MISC
1.0000 | Freq: Once | Status: AC
  Administered 2018-09-13: 1

## 2018-09-13 MED ORDER — CETIRIZINE HCL 10 MG PO TABS: 10.0000 mg | ORAL_TABLET | Freq: Every day | ORAL | 0 refills | Status: DC

## 2018-09-13 MED ORDER — FLUTICASONE PROPIONATE 50 MCG/ACT NA SUSP: 2.0000 | Freq: Every day | NASAL | 0 refills | Status: DC

## 2018-09-13 MED ORDER — ALBUTEROL SULFATE HFA 108 (90 BASE) MCG/ACT IN AERS: 1.0000 | INHALATION_SPRAY | Freq: Four times a day (QID) | RESPIRATORY_TRACT | 0 refills | Status: DC | PRN

## 2018-09-13 NOTE — ED Provider Notes (Signed)
[facility] Provider Note   CSN: [id number] Arrival date & time: 09/13/18  1300    History   Chief Complaint Chief Complaint  Patient presents with  . Shortness of Breath    HPI Billy Pace is a 38 y.o. male.     Billy Pace is a 38 y.o. male with a history of allergic rhinitis and rectal abscess, who presents to the ED with shortness of breath and chest congestion which he noted this morning when he woke up. Pt describes shortness of breath and his chest tightness and feeling like he cannot take a good deep breath.  He has not noted any wheezing.  He does not have a history of asthma or reactive airway disease.  Denies any smoking history.  He reports he has coughed once today.  No fevers.  No known sick contacts.  No associated chest pain.  No lower extremity swelling or pain.  No recent travel, or long distance surgeries.  No personal history of DVT or PE, does report that his mom has had a PE previously.  Patient denies history of similar symptoms in the past.  He does report that he used to be on an allergy nasal spray and stopped this and wonders if this is contributing he does report some nasal congestion.  No sore throat.  Patient also expresses concern about rectal abscess, he has a history of one previously, states that when he has wiped after having a bowel movement he is felt a small raised area of skin just to the left of his rectum where he previously had an abscess, he reports this is nontender and he has not had any drainage but he is concerned that this could be starting to come back.     Past Medical History:  Diagnosis Date  . Rectal abscess     There are no active problems to display for this patient.   History reviewed. No pertinent surgical history.      Home Medications    Prior to Admission medications   Medication Sig Start Date End Date Taking? Authorizing Provider  diphenhydrAMINE (BENADRYL) 25 MG tablet  Take 1 tablet (25 mg total) by mouth at bedtime as needed. Patient taking differently: Take 25 mg by mouth at bedtime as needed for sleep.  05/30/16  Yes Loletta SpecterGomez, Roger David, PA-C  MELATONIN PO Take 1 tablet by mouth as needed (sleep).   Yes [provider]  Multiple Vitamin (MULTIVITAMIN) tablet Take 1 tablet by mouth daily.   Yes [provider]  benzonatate (TESSALON) 200 MG capsule Take 1 capsule (200 mg total) by mouth 2 (two) times daily as needed for cough. Patient not taking: Reported on 07/30/2017 05/30/16   Loletta SpecterGomez, Roger David, PA-C  Cholecalciferol (VITAMIN D PO) Take 1 capsule by mouth daily.    [provider]  loratadine (CLARITIN) 10 MG tablet Take 1 tablet (10 mg total) by mouth daily. Patient not taking: Reported on 07/30/2017 05/30/16   Loletta SpecterGomez, Roger David, PA-C    Family History No family history on file.  Social History Social History   Tobacco Use  . Smoking status: Never Smoker  . Smokeless tobacco: Never Used  Substance Use Topics  . Alcohol use: No  . Drug use: No     Allergies   Patient has no known allergies.   Review of Systems Review of Systems  Constitutional: Negative for chills and fever.  HENT: Positive for congestion. Negative for  rhinorrhea and sore throat.   Respiratory: Positive for chest tightness and shortness of breath. Negative for cough and wheezing.   Cardiovascular: Negative for chest pain and leg swelling.  Gastrointestinal: Negative for abdominal pain, diarrhea, nausea and vomiting.  Genitourinary: Negative for dysuria.  Musculoskeletal: Negative for arthralgias and myalgias.  Skin: Negative for color change and rash.  Neurological: Negative for dizziness, syncope, light-headedness and headaches.     Physical Exam Updated Vital Signs BP 113/73 (BP Location: Right Arm)   Pulse 82   Temp 98.1 F (36.7 C) (Oral)   Resp 14   SpO2 97%   Physical Exam Vitals signs and nursing note reviewed. Exam conducted with  a chaperone present.  Constitutional:      General: He is not in acute distress.    Appearance: He is well-developed and normal weight. He is not diaphoretic.  HENT:     Head: Normocephalic and atraumatic.     Nose:     Comments: Mild mucosal edema with clear rhinorrhea present    Mouth/Throat:     Mouth: Mucous membranes are moist.     Pharynx: Oropharynx is clear.  Eyes:     General:        Right eye: No discharge.        Left eye: No discharge.     Pupils: Pupils are equal, round, and reactive to light.  Neck:     Musculoskeletal: Neck supple.  Cardiovascular:     Rate and Rhythm: Normal rate and regular rhythm.     Heart sounds: Normal heart sounds. No murmur. No friction rub. No gallop.   Pulmonary:     Effort: Pulmonary effort is normal. No respiratory distress.     Breath sounds: Normal breath sounds. No wheezing or rales.     Comments: Respirations equal and unlabored, patient able to speak in full sentences, lungs clear to auscultation bilaterally but with some decreased air movement in lower lobes bilaterally Abdominal:     General: Bowel sounds are normal. There is no distension.     Palpations: Abdomen is soft. There is no mass.     Tenderness: There is no abdominal tenderness. There is no guarding.     Comments: Abdomen soft, nondistended, nontender to palpation in all quadrants without guarding or peritoneal signs  Genitourinary:    Comments: Examined perirectal area where patient reports concern for perirectal abscess.  Small area of scar tissue noted from previous I&D, no surrounding erythema, fluctuance, expressible drainage or palpable abscess. Musculoskeletal:        General: No deformity.     Comments: Bilateral lower extremities warm and well perfused without edema or tenderness.  2+ DP pulses bilaterally.  Skin:    General: Skin is warm and dry.     Capillary Refill: Capillary refill takes less than 2 seconds.  Neurological:     Mental Status: He is alert.      Coordination: Coordination normal.     Comments: Speech is clear, able to follow commands Moves extremities without ataxia, coordination intact  Psychiatric:        Mood and Affect: Mood normal.        Behavior: Behavior normal.      ED Treatments / Results  Labs (all labs ordered are listed, but only abnormal results are displayed) Labs Reviewed  NOVEL CORONAVIRUS, NAA (HOSPITAL ORDER, SEND-OUT TO REF LAB)  BASIC METABOLIC PANEL  CBC WITH DIFFERENTIAL/PLATELET  D-DIMER, QUANTITATIVE (NOT [facility])  TROPONIN I (HIGH  SENSITIVITY)    EKG EKG Interpretation  Date/Time:  Friday September 13 2018 13:16:27 EDT Ventricular Rate:  79 PR Interval:  160 QRS Duration: 84 QT Interval:  346 QTC Calculation: 396 R Axis:   8 Text Interpretation:  Normal sinus rhythm Left ventricular hypertrophy T wave abnormality, consider inferolateral ischemia Confirmed by Palumbo, April ([id number]) on 09/14/2018 12:48:14 PM   Radiology Dg Chest Port 1 View  Result Date: 09/13/2018 CLINICAL DATA:  Shortness of breath EXAM: PORTABLE CHEST 1 VIEW COMPARISON:  January 11, 2015 FINDINGS: Lungs are clear. Heart size and pulmonary vascularity are normal. No adenopathy. No bone lesions. IMPRESSION: No edema or consolidation.  Stable cardiac silhouette. Electronically Signed   By: Bretta BangWilliam  Woodruff III M.D.   On: 09/13/2018 15:10    Procedures Procedures (including critical care time)  Medications Ordered in ED Medications  albuterol (VENTOLIN HFA) 108 (90 Base) MCG/ACT inhaler 2 puff (2 puffs Inhalation Given 09/13/18 1450)  AeroChamber Plus Flo-Vu Large MISC 1 each (1 each Other Given 09/13/18 1450)     Initial Impression / Assessment and Plan / ED Course  I have reviewed the triage vital signs and the nursing notes.  Pertinent labs & imaging results that were available during my care of the patient were reviewed by me and considered in my medical decision making (see chart for details).  Patient  presents with shortness of breath that started this morning, and sensation of chest congestion.  No chest pain.  On arrival patient with normal vitals.  No associated cough and no fevers.  No known sick contacts. Doubt ACS and patient is overall low risk for PE Does have family history.  On exam patient has some decreased air movement in the lower lung fields suggestive of possible bronchospasm.  Patient does have history of allergic rhinitis.  Will check basic labs, troponins, EKG, chest x-ray, d-dimer and will send outpatient COVID swab, albuterol given for symptom management.  Work-up is very reassuring, EKG without concerning ischemic changes, negative troponin.  Given that symptoms have been constant for > 6 hours and troponin negative, no delta troponin indicated, patient is low risk for ACS.  No leukocytosis and normal hemoglobin.  No acute electrolyte derangements and normal renal function.  D-dimer is negative as well.  Chest x-ray does not show any evidence of pneumonia or other active cardiopulmonary disease.  Coronavirus swab pending.   Patient had expressed concern for possible recurrence of rectal abscess as he felt small raised area over the skin but this is nontender with no surrounding erythema induration or fluctuance and no drainage.  On exam this is consistent with small area of scar tissue from previous I&D.  Provided with reassurance.  On reevaluation patient has had complete resolution of symptoms with albuterol inhaler, suggest home degree of bronchospasm as cause.  Will send patient home with inhaler and start him back on allergy medications he is not wheezing has good air movement, do not think patient needs steroids at this time.  Discussed appropriate home quarantine until coronavirus test results return.  Patient expresses understanding and agreement with plan.  Discharged home in good condition.  Return precautions discussed.  Final Clinical Impressions(s) / ED Diagnoses    Final diagnoses:  SOB (shortness of breath)    ED Discharge Orders         Ordered    fluticasone (FLONASE) 50 MCG/ACT nasal spray  Daily     09/13/18 1705    cetirizine (ZYRTEC) 10 MG tablet  Daily     09/13/18 1705    albuterol (VENTOLIN HFA) 108 (90 Base) MCG/ACT inhaler  Every 6 hours PRN     09/13/18 1705           Jacqlyn Larsen, [place] 09/15/18 2258    Sherwood Gambler, MD 09/17/18 [id number]

## 2018-09-13 NOTE — Discharge Instructions (Signed)
Your work-up today has been reassuring and I am glad that albuterol has helped somewhat with your symptoms.  We did not see evidence of an acute problem with your heart or blood clot.  Please continue using albuterol inhaler as needed I would also like for you to start back on your allergy medications, Flonase and Zyrtec.

## 2018-09-13 NOTE — ED Triage Notes (Signed)
Pt states he woke up with shortness of breath and congestion today. Pt also would like a boil on his rectum assessed. Denies fevers.

## 2018-09-14 LAB — NOVEL CORONAVIRUS, NAA (HOSP ORDER, SEND-OUT TO REF LAB; TAT 18-24 HRS): SARS-CoV-2, NAA: NOT DETECTED

## 2020-03-17 ENCOUNTER — Emergency Department (HOSPITAL_COMMUNITY)
Admission: EM | Admit: 2020-03-17 | Discharge: 2020-03-18 | Disposition: A | Discharge status: Home / Self Care | Payer: Medicaid Other | Attending: Emergency Medicine | Admitting: Emergency Medicine

## 2020-03-17 ENCOUNTER — Other Ambulatory Visit: Payer: Self-pay

## 2020-03-17 ENCOUNTER — Emergency Department (HOSPITAL_COMMUNITY): Payer: Medicaid Other

## 2020-03-17 DIAGNOSIS — M79671 Pain in right foot: Secondary | ICD-10-CM | POA: Diagnosis present

## 2020-03-17 NOTE — ED Triage Notes (Signed)
Pt here with c/o a knot on his right foot for 2 weeks  , slightly  painful ,

## 2020-03-18 NOTE — ED Provider Notes (Signed)
[facility] EMERGENCY DEPARTMENT Provider Note   CSN: [id number] Arrival date & time: 03/17/20  1514     History No chief complaint on file.   Billy Pace is a 40 y.o. male.  Patient has a hard growth on the lateral portion of his right dorsal foot.  When asked how long he is there he states longer than 30 days.  He is not sure how long.  He is does state it has been less than 10 years though.  Not really painful.  He has a history of lipomas but states this feels not similar.  No fevers.  No redness or drainage from the area.        Past Medical History:  Diagnosis Date  . Rectal abscess     There are no problems to display for this patient.   No past surgical history on file.     No family history on file.  Social History   Tobacco Use  . Smoking status: Never Smoker  . Smokeless tobacco: Never Used  Substance Use Topics  . Alcohol use: No  . Drug use: No    Home Medications Prior to Admission medications   Medication Sig Start Date End Date Taking? Authorizing Provider  albuterol (VENTOLIN HFA) 108 (90 Base) MCG/ACT inhaler Inhale 1-2 puffs into the lungs every 6 (six) hours as needed for wheezing or shortness of breath. 09/13/18   Dartha Lodge, PA-C  cetirizine (ZYRTEC) 10 MG tablet Take 1 tablet (10 mg total) by mouth daily. 09/13/18   Dartha Lodge, PA-C  Cholecalciferol (VITAMIN D PO) Take 1 capsule by mouth daily.    [provider]  diphenhydrAMINE (BENADRYL) 25 MG tablet Take 1 tablet (25 mg total) by mouth at bedtime as needed. Patient taking differently: Take 25 mg by mouth at bedtime as needed for sleep.  05/30/16   Loletta Specter, PA-C  fluticasone [facility]) 50 MCG/ACT nasal spray Place 2 sprays into both nostrils daily. 09/13/18   Dartha Lodge, PA-C  MELATONIN PO Take 1 tablet by mouth as needed (sleep).    [provider]  Multiple Vitamin (MULTIVITAMIN) tablet Take 1 tablet by mouth daily.    [provider]  loratadine (CLARITIN) 10 MG tablet Take 1 tablet (10 mg total) by mouth daily. Patient not taking: Reported on 07/30/2017 05/30/16 03/18/20  Loletta Specter, PA-C    Allergies    Patient has no known allergies.  Review of Systems   Review of Systems  All other systems reviewed and are negative.   Physical Exam Updated Vital Signs BP 117/69   Pulse 72   Temp 98.3 F (36.8 C) (Oral)   Resp 18   SpO2 100%   Physical Exam Vitals and nursing note reviewed.  Constitutional:      Appearance: He is well-developed and well-nourished.  HENT:     Head: Normocephalic and atraumatic.     Mouth/Throat:     Mouth: Mucous membranes are moist.     Pharynx: Oropharynx is clear.  Eyes:     Pupils: Pupils are equal, round, and reactive to light.  Cardiovascular:     Rate and Rhythm: Normal rate.  Pulmonary:     Effort: Pulmonary effort is normal. No respiratory distress.  Abdominal:     General: There is no distension.  Musculoskeletal:        General: Normal range of motion.     Cervical back: Normal range of motion.  Comments: Immobile dense 1.5 cm circular right dorsal foot.  No erythema, tenderness, warmth or other associated symptoms.  Skin:    General: Skin is warm and dry.  Neurological:     General: No focal deficit present.     Mental Status: He is alert.     ED Results / Procedures / Treatments   Labs (all labs ordered are listed, but only abnormal results are displayed) Labs Reviewed - No data to display  EKG None  Radiology DG Foot Complete Right  Result Date: 03/17/2020 CLINICAL DATA:  Right foot soft tissue swelling x2 weeks. EXAM: RIGHT FOOT COMPLETE - 3+ VIEW COMPARISON:  None. FINDINGS: There is no evidence of fracture or dislocation. There is no evidence of arthropathy or other focal bone abnormality. Moderate severity soft tissue swelling is seen along the lateral aspect of the mid right foot. Mild dorsal soft tissue swelling is also seen  along the mid right foot. No radiopaque soft tissue foreign bodies are seen. IMPRESSION: 1. Dorsal and lateral soft tissue swelling, without evidence of acute osseous abnormality. Electronically Signed   By: Aram Candela M.D.   On: 03/17/2020 17:28    Procedures Procedures (including critical care time)  Medications Ordered in ED Medications - No data to display  ED Course  I have reviewed the triage vital signs and the nursing notes.  Pertinent labs & imaging results that were available during my care of the patient were reviewed by me and considered in my medical decision making (see chart for details).    MDM Rules/Calculators/A&P                          Not a lipoma.  Not a cyst Not an abscess Doesn't appear to be calcified on xray, no bony problems. Doubt cancer Not clear what etiology is. Podiatry follow up would probably be helpful though.   Final Clinical Impression(s) / ED Diagnoses Final diagnoses:  Right foot pain    Rx / DC Orders ED Discharge Orders    None       Kelsee Preslar, Barbara Cower, MD 03/18/20 0330

## 2020-03-22 DIAGNOSIS — B351 Tinea unguium: Secondary | ICD-10-CM

## 2021-07-03 IMAGING — CR DG FOOT COMPLETE 3+V*R*
3 series · 3 of 3 positions shown · non-contrast
Comparison: None.

CLINICAL DATA: Right foot soft tissue swelling x2 weeks.

EXAM:
RIGHT FOOT COMPLETE - 3+ VIEW

[foot ap]
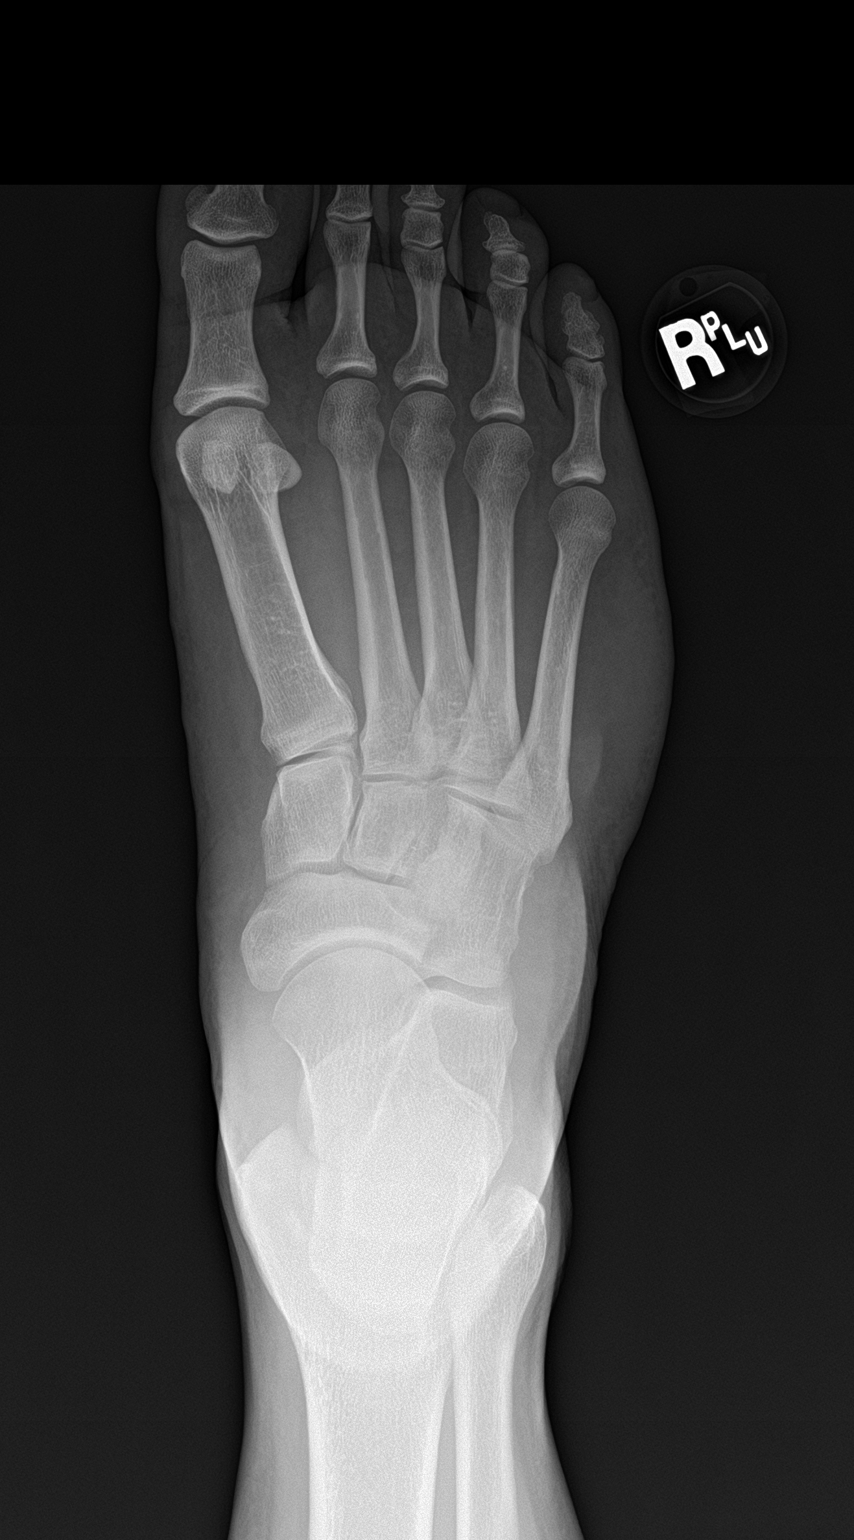

[foot obl]
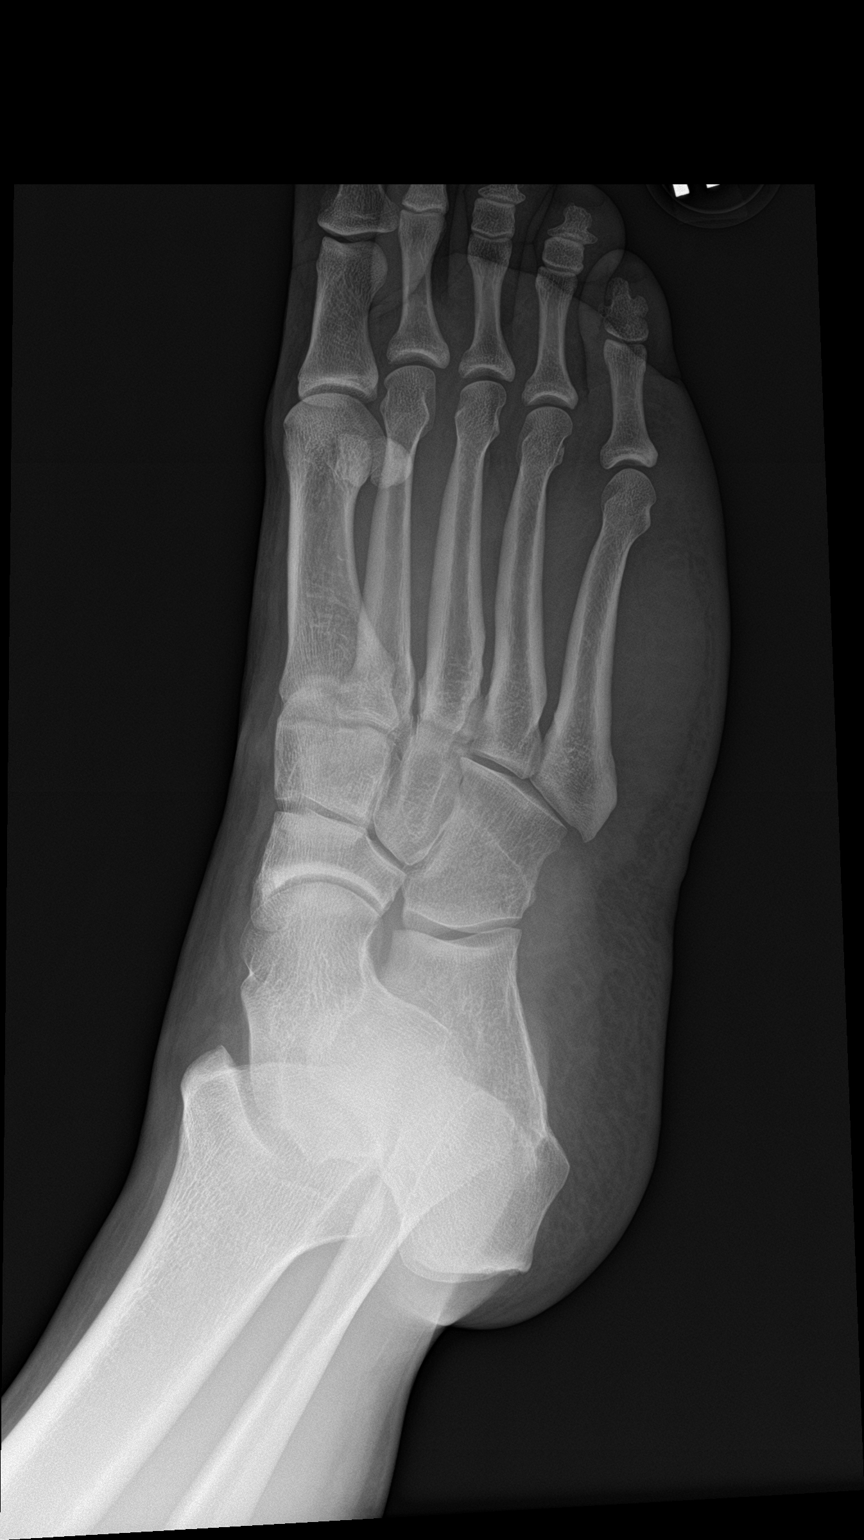

[foot lat]
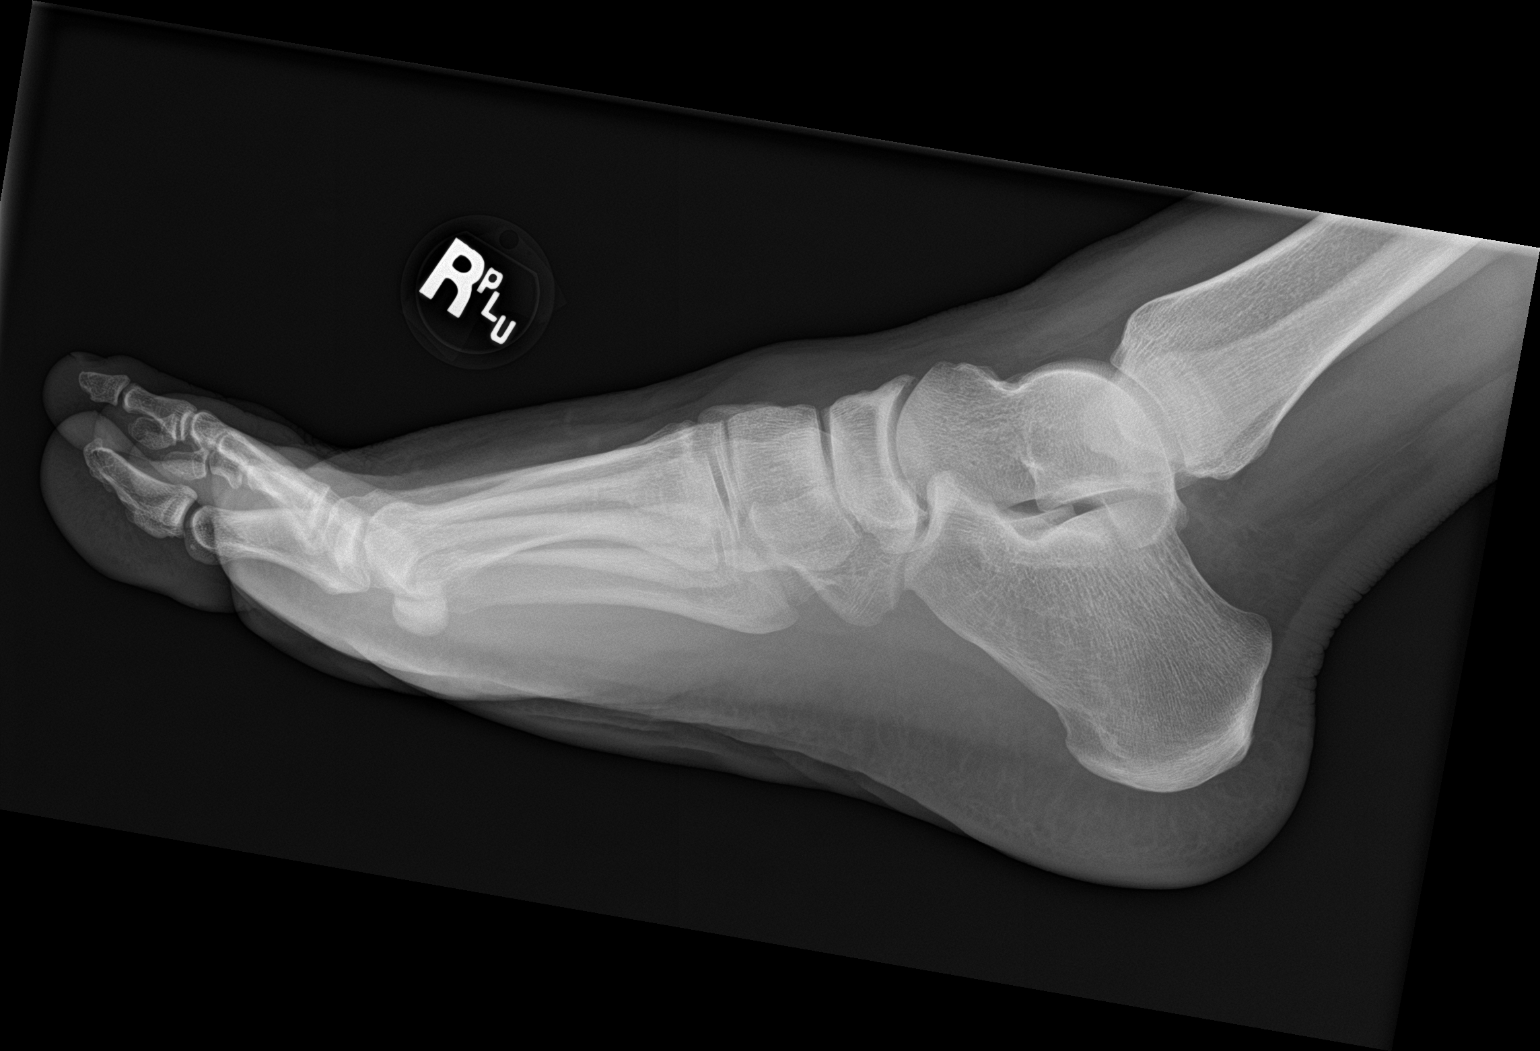

[3 of 3 positions shown; findings below may reference images not displayed]

FINDINGS: There is no evidence of fracture or dislocation. There is no
evidence of arthropathy or other focal bone abnormality. Moderate
severity soft tissue swelling is seen along the lateral aspect of
the mid right foot. Mild dorsal soft tissue swelling is also seen
along the mid right foot. No radiopaque soft tissue foreign bodies
are seen.
IMPRESSION: 1. Dorsal and lateral soft tissue swelling, without evidence of
acute osseous abnormality.

## 2022-01-04 ENCOUNTER — Emergency Department (HOSPITAL_COMMUNITY)
Admission: EM | Admit: 2022-01-04 | Discharge: 2022-01-04 | Disposition: A | Discharge status: Home / Self Care | Payer: Medicaid Other | Attending: Emergency Medicine | Admitting: Emergency Medicine

## 2022-01-04 ENCOUNTER — Encounter (HOSPITAL_COMMUNITY): Payer: Self-pay

## 2022-01-04 ENCOUNTER — Other Ambulatory Visit: Payer: Self-pay

## 2022-01-04 DIAGNOSIS — Z23 Encounter for immunization: Secondary | ICD-10-CM | POA: Diagnosis not present

## 2022-01-04 DIAGNOSIS — L03012 Cellulitis of left finger: Secondary | ICD-10-CM | POA: Insufficient documentation

## 2022-01-04 MED ORDER — SULFAMETHOXAZOLE-TRIMETHOPRIM 800-160 MG PO TABS: 1.0000 | ORAL_TABLET | Freq: Two times a day (BID) | ORAL | 0 refills | Status: DC

## 2022-01-04 MED ORDER — TETANUS-DIPHTH-ACELL PERTUSSIS 5-2.5-18.5 LF-MCG/0.5 IM SUSY
0.5000 mL | PREFILLED_SYRINGE | Freq: Once | INTRAMUSCULAR | Status: AC
  Administered 2022-01-04: 0.5 mL via INTRAMUSCULAR
  Filled 2022-01-04: qty 0.5

## 2022-01-04 MED ORDER — LIDOCAINE HCL 2 % IJ SOLN
10.0000 mL | Freq: Once | INTRAMUSCULAR | Status: AC
  Administered 2022-01-04: 200 mg via INTRADERMAL
  Filled 2022-01-04: qty 20

## 2022-01-04 MED ORDER — IBUPROFEN 600 MG PO TABS: 600.0000 mg | ORAL_TABLET | Freq: Four times a day (QID) | ORAL | 0 refills | Status: DC | PRN

## 2022-01-04 NOTE — ED Provider Triage Note (Signed)
Emergency Medicine Provider Triage Evaluation Note  Billy Pace , a 41 y.o. male  was evaluated in triage.  Pt complains of paronychia to the left index finger which began 4 days ago.  Thought this was likely an allergy to peanuts, has not taken anything for improvement in symptoms.  Does not have a history of diabetes..  Review of Systems  Positive: Finger pain Negative: fever  Physical Exam  There were no vitals taken for this visit. Gen:   Awake, no distress   Resp:  Normal effort  MSK:   Moves extremities without difficulty  Other:  Small paronychia to the left index finger  Medical Decision Making  Medically screening exam initiated at 1:16 PM.  Appropriate orders placed.  Billy Pace was informed that the remainder of the evaluation will be completed by another provider, this initial triage assessment does not replace that evaluation, and the importance of remaining in the ED until their evaluation is complete.     Claude Manges, PA-C 01/04/22 1318

## 2022-01-04 NOTE — Discharge Instructions (Signed)
Clean wound daily with gentle soap and water and apply dressing.  Take antibiotic as prescribed.  Avoid nailbiting as it may worsen your symptoms or causing another infection.

## 2022-01-04 NOTE — ED Provider Notes (Signed)
[facility] Provider Note   CSN: [id number] Arrival date & time: 01/04/22  1115     History  Chief Complaint  Patient presents with   Wound Infection    Billy Pace is a 41 y.o. male.  The history is provided by the patient. No language interpreter was used.     41 year old male presenting with complaints of finger pain.  Patient report throbbing pain swelling and redness involving his left index finger for the past 5 days.  He admits to biting his nails on a regular basis.  He is unsure of his tetanus status.  He denies any numbness denies any specific injury.  He is right-hand dominant.  No specific treatment tried.  Home Medications Prior to Admission medications   Medication Sig Start Date End Date Taking? Authorizing Provider  albuterol (VENTOLIN HFA) 108 (90 Base) MCG/ACT inhaler Inhale 1-2 puffs into the lungs every 6 (six) hours as needed for wheezing or shortness of breath. 09/13/18   Jacqlyn Larsen, PA-C  cetirizine (ZYRTEC) 10 MG tablet Take 1 tablet (10 mg total) by mouth daily. 09/13/18   Jacqlyn Larsen, PA-C  Cholecalciferol (VITAMIN D PO) Take 1 capsule by mouth daily.    [provider]  diphenhydrAMINE (BENADRYL) 25 MG tablet Take 1 tablet (25 mg total) by mouth at bedtime as needed. Patient taking differently: Take 25 mg by mouth at bedtime as needed for sleep.  05/30/16   Clent Demark, PA-C  fluticasone [facility]) 50 MCG/ACT nasal spray Place 2 sprays into both nostrils daily. 09/13/18   Jacqlyn Larsen, PA-C  MELATONIN PO Take 1 tablet by mouth as needed (sleep).    [provider]  Multiple Vitamin (MULTIVITAMIN) tablet Take 1 tablet by mouth daily.    [provider]  loratadine (CLARITIN) 10 MG tablet Take 1 tablet (10 mg total) by mouth daily. Patient not taking: Reported on 07/30/2017 05/30/16 03/18/20  Clent Demark, PA-C      Allergies    Patient has no known allergies.    Review of  Systems   Review of Systems  All other systems reviewed and are negative.   Physical Exam Updated Vital Signs BP 127/89 (BP Location: Left Arm)   Pulse 83   Temp 98.7 F (37.1 C) (Oral)   Resp 18   Ht 6\' 2"  (1.88 m)   Wt 108.9 kg   SpO2 97%   BMI 30.81 kg/m  Physical Exam Vitals and nursing note reviewed.  Constitutional:      General: He is not in acute distress.    Appearance: He is well-developed.  HENT:     Head: Atraumatic.  Eyes:     Conjunctiva/sclera: Conjunctivae normal.  Musculoskeletal:        General: Swelling (Left index finger: Moderate erythema edema and warmth noted to the cuticle nail fold consistent with a paronychia.  Brisk cap refill.  No joint pain.) present.     Cervical back: Neck supple.  Skin:    Findings: No rash.  Neurological:     Mental Status: He is alert.     ED Results / Procedures / Treatments   Labs (all labs ordered are listed, but only abnormal results are displayed) Labs Reviewed - No data to display  EKG None  Radiology No results found.  Procedures .Marland KitchenIncision and Drainage  Date/Time: 01/04/2022 6:36 PM  Performed by: Domenic Moras, PA-C Authorized by: Domenic Moras, PA-C   Consent:  Consent obtained:  Verbal   Consent given by:  Patient   Risks discussed:  Bleeding, incomplete drainage, pain and damage to other organs   Alternatives discussed:  No treatment Universal protocol:    Procedure explained and questions answered to patient or proxy's satisfaction: yes     Relevant documents present and verified: yes     Test results available : yes     Imaging studies available: yes     Required blood products, implants, devices, and special equipment available: yes     Site/side marked: yes     Immediately prior to procedure, a time out was called: yes     Patient identity confirmed:  Verbally with patient Location:    Type:  Abscess   Size:  1   Location:  Upper extremity   Upper extremity location:  Finger    Finger location:  L index finger Pre-procedure details:    Skin preparation:  Betadine Anesthesia:    Anesthesia method:  Nerve block   Block location:  Digital nerve block   Block needle gauge:  25 G   Block anesthetic:  Lidocaine 2% w/o epi   Block technique:  Digital nerve block   Block injection procedure:  Anatomic landmarks identified, introduced needle, incremental injection and negative aspiration for blood   Block outcome:  Anesthesia achieved Procedure type:    Complexity:  Simple Procedure details:    Incision types:  Single straight   Incision depth:  Subcutaneous   Wound management:  Probed and deloculated, irrigated with saline and extensive cleaning   Drainage:  Purulent   Drainage amount:  Scant   Packing materials:  None Post-procedure details:    Procedure completion:  Tolerated well, no immediate complications     Medications Ordered in ED Medications  Tdap (BOOSTRIX) injection 0.5 mL (0.5 mLs Intramuscular Given 01/04/22 1817)  lidocaine (XYLOCAINE) 2 % (with pres) injection 200 mg (200 mg Intradermal Given by Other 01/04/22 1819)    ED Course/ Medical Decision Making/ A&P                           Medical Decision Making Risk Prescription drug management.   BP 127/89 (BP Location: Left Arm)   Pulse 83   Temp 98.7 F (37.1 C) (Oral)   Resp 18   Ht 6\' 2"  (1.88 m)   Wt 108.9 kg   SpO2 97%   BMI 30.81 kg/m   39:12 PM  41 year old male presenting with complaints of finger pain.  Patient report throbbing pain swelling and redness involving his left index finger for the past 5 days.  He admits to biting his nails on a regular basis.  He is unsure of his tetanus status.  He denies any numbness denies any specific injury.  He is right-hand dominant.  No specific treatment tried.  On exam, left index finger has significant erythema edema and warmth noted to the cuticle nail fold consistent with a paronychia.  No joint involvement.  Brisk cap refill.  No  true nail involvement.  No deformity.  HPI -labs considered but pt is well appearing with stable vital sign, doubt systemic infection. -DDx: paronychia, felon, abscess, cellulitis, fb -treatment includes I&D -PCP office notes or outside notes reviewed -patients feels much better, is comfortable with discharge, and will follow up with PCP -Prescription medication considered, patient comfortable with bactrim and ibuprofen -Social Determinant of Health considered   6:38 PM Successful incision and  drainage of left index paronychia.  Patient tolerates well.  Will discharge home with Bactrim.  Wound care instruction provided.  Return precaution given.        Final Clinical Impression(s) / ED Diagnoses Final diagnoses:  Paronychia of left index finger    Rx / DC Orders ED Discharge Orders          Ordered    sulfamethoxazole-trimethoprim (BACTRIM DS) 800-160 MG tablet  2 times daily        01/04/22 1839    ibuprofen (ADVIL) 600 MG tablet  Every 6 hours PRN        01/04/22 1839              Domenic Moras, PA-C 01/04/22 1840    Tegeler, Gwenyth Allegra, MD 01/04/22 2351

## 2022-01-04 NOTE — ED Notes (Signed)
Discharge instructions reviewed with patient. Patient denies any questions or concerns.   

## 2022-01-04 NOTE — ED Triage Notes (Signed)
Patient has infection under cuticle on left index finger since Saturday.

## 2022-01-10 ENCOUNTER — Emergency Department (HOSPITAL_COMMUNITY): Payer: Medicaid Other

## 2022-01-10 ENCOUNTER — Observation Stay (HOSPITAL_COMMUNITY)
Admission: EM | Admit: 2022-01-10 | Discharge: 2022-01-12 | Disposition: A | Discharge status: Home / Self Care | Payer: Medicaid Other | Attending: Internal Medicine | Admitting: Internal Medicine

## 2022-01-10 ENCOUNTER — Encounter (HOSPITAL_COMMUNITY): Payer: Self-pay

## 2022-01-10 DIAGNOSIS — E860 Dehydration: Principal | ICD-10-CM | POA: Diagnosis present

## 2022-01-10 DIAGNOSIS — T368X5A Adverse effect of other systemic antibiotics, initial encounter: Secondary | ICD-10-CM | POA: Diagnosis not present

## 2022-01-10 DIAGNOSIS — K1379 Other lesions of oral mucosa: Secondary | ICD-10-CM | POA: Diagnosis not present

## 2022-01-10 DIAGNOSIS — R112 Nausea with vomiting, unspecified: Secondary | ICD-10-CM | POA: Diagnosis present

## 2022-01-10 DIAGNOSIS — E871 Hypo-osmolality and hyponatremia: Secondary | ICD-10-CM | POA: Diagnosis not present

## 2022-01-10 DIAGNOSIS — N179 Acute kidney failure, unspecified: Secondary | ICD-10-CM | POA: Diagnosis not present

## 2022-01-10 DIAGNOSIS — Z7951 Long term (current) use of inhaled steroids: Secondary | ICD-10-CM | POA: Diagnosis not present

## 2022-01-10 DIAGNOSIS — L511 Stevens-Johnson syndrome: Secondary | ICD-10-CM | POA: Diagnosis not present

## 2022-01-10 DIAGNOSIS — Z1152 Encounter for screening for COVID-19: Secondary | ICD-10-CM | POA: Diagnosis not present

## 2022-01-10 DIAGNOSIS — Z79899 Other long term (current) drug therapy: Secondary | ICD-10-CM | POA: Diagnosis not present

## 2022-01-10 DIAGNOSIS — J45909 Unspecified asthma, uncomplicated: Secondary | ICD-10-CM | POA: Diagnosis not present

## 2022-01-10 DIAGNOSIS — T50905A Adverse effect of unspecified drugs, medicaments and biological substances, initial encounter: Secondary | ICD-10-CM

## 2022-01-10 LAB — COMPREHENSIVE METABOLIC PANEL
ALT: 53 U/L — ABNORMAL HIGH (ref 0–44)
AST: 52 U/L — ABNORMAL HIGH (ref 15–41)
Albumin: 2 g/dL — ABNORMAL LOW (ref 3.5–5.0)
Alkaline Phosphatase: 31 U/L — ABNORMAL LOW (ref 38–126)
Anion gap: 11 (ref 5–15)
BUN: 36 mg/dL — ABNORMAL HIGH (ref 6–20)
CO2: 18 mmol/L — ABNORMAL LOW (ref 22–32)
Calcium: 7.1 mg/dL — ABNORMAL LOW (ref 8.9–10.3)
Chloride: 101 mmol/L (ref 98–111)
Creatinine, Ser: 1.39 mg/dL — ABNORMAL HIGH (ref 0.61–1.24)
GFR, Estimated: >60 mL/min (ref >60)
Glucose, Bld: 217 mg/dL — ABNORMAL HIGH (ref 70–99)
Potassium: 4.4 mmol/L (ref 3.5–5.1)
Sodium: 130 mmol/L — ABNORMAL LOW (ref 135–145)
Total Bilirubin: 0.6 mg/dL (ref 0.3–1.2)
Total Protein: 5.5 g/dL — ABNORMAL LOW (ref 6.5–8.1)

## 2022-01-10 LAB — CBC WITH DIFFERENTIAL/PLATELET
Abs Immature Granulocytes: 0.1 10*3/uL — ABNORMAL HIGH (ref 0.00–0.07)
Band Neutrophils: 11 %
Basophils Absolute: 0 10*3/uL (ref 0.0–0.1)
Basophils Relative: 0 %
Eosinophils Absolute: 0.1 10*3/uL (ref 0.0–0.5)
Eosinophils Relative: 1 %
HCT: 50.1 % (ref 39.0–52.0)
Hemoglobin: 17.6 g/dL — ABNORMAL HIGH (ref 13.0–17.0)
Lymphocytes Relative: 26 %
Lymphs Abs: 1.6 10*3/uL (ref 0.7–4.0)
MCH: 30.7 pg (ref 26.0–34.0)
MCHC: 35.1 g/dL (ref 30.0–36.0)
MCV: 87.3 fL (ref 80.0–100.0)
Metamyelocytes Relative: 1 %
Monocytes Absolute: 0.5 10*3/uL (ref 0.1–1.0)
Monocytes Relative: 9 %
Myelocytes: 1 %
Neutro Abs: 3.8 10*3/uL (ref 1.7–7.7)
Neutrophils Relative %: 51 %
Platelets: 285 10*3/uL (ref 150–400)
RBC: 5.74 MIL/uL (ref 4.22–5.81)
RDW: 12.7 % (ref 11.5–15.5)
Smear Review: NORMAL
WBC: 6.1 10*3/uL (ref 4.0–10.5)
nRBC: 0.3 % — ABNORMAL HIGH (ref 0.0–0.2)

## 2022-01-10 LAB — LACTIC ACID, PLASMA: Lactic Acid, Venous: 3.3 mmol/L (ref 0.5–1.9)

## 2022-01-10 LAB — RESP PANEL BY RT-PCR (FLU A&B, COVID) ARPGX2
Influenza A by PCR: NEGATIVE
Influenza B by PCR: NEGATIVE
SARS Coronavirus 2 by RT PCR: NEGATIVE

## 2022-01-10 MED ORDER — SODIUM CHLORIDE 0.9 % IV SOLN
2.0000 g | Freq: Once | INTRAVENOUS | Status: DC
  Filled 2022-01-10: qty 20

## 2022-01-10 MED ORDER — SODIUM CHLORIDE 0.9 % IV SOLN: 1.0000 g | Freq: Once | INTRAVENOUS | Status: DC

## 2022-01-10 MED ORDER — SODIUM CHLORIDE 0.9 % IV BOLUS
1000.0000 mL | Freq: Once | INTRAVENOUS | Status: AC
  Administered 2022-01-10: 1000 mL via INTRAVENOUS

## 2022-01-10 NOTE — ED Notes (Signed)
Date and time results received: 01/10/22 7:00 PM  (use smartphrase ".now" to insert current time)  Test: Lactic Acid Critical Value: 3.  Name of Provider Notified: Idelle Crouch.   Orders Received? Or Actions Taken?:  Awaiting room.

## 2022-01-10 NOTE — ED Triage Notes (Signed)
Pt states he was seen approximately one week ago for infection to L index finger. Given abx at that time. Since then pt has had frequent vomiting, poor PO intake and lethargy. Pt tachycardic in 130's during triage. Denies any abd pain, CP, SOB.

## 2022-01-10 NOTE — ED Provider Triage Note (Signed)
Emergency Medicine Provider Triage Evaluation Note  REVAN GENDRON , a 41 y.o. male  was evaluated in triage.  Pt complains of infection to 2nd left finger, placed on antibiotics, has developed increased weakness, inability to eat or drink and now a fever. Reports vomiting since being placed on antibiotics.   Review of Systems  Positive: vomiting, fever, chills Negative: Abdominal pain  Physical Exam  BP (!) 136/120 (BP Location: Right Arm)   Pulse (!) 138   Temp 98.5 F (36.9 C) (Oral)   Resp 20   SpO2 92%  Gen:   Awake, ill appearing, tachycardic Resp:  Normal effort  MSK:   Moves extremities without difficulty  Other:  +erythematous 2nd digit of L hand  Medical Decision Making  Medically screening exam initiated at 5:27 PM.  Appropriate orders placed.  Orlie Dakin Ballo was informed that the remainder of the evaluation will be completed by another provider, this initial triage assessment does not replace that evaluation, and the importance of remaining in the ED until their evaluation is complete.    Pete Pelt, [place] 01/10/22 1740

## 2022-01-11 ENCOUNTER — Other Ambulatory Visit: Payer: Self-pay

## 2022-01-11 ENCOUNTER — Encounter (HOSPITAL_COMMUNITY): Payer: Self-pay | Admitting: Internal Medicine

## 2022-01-11 ENCOUNTER — Emergency Department (HOSPITAL_COMMUNITY): Payer: Medicaid Other

## 2022-01-11 DIAGNOSIS — E860 Dehydration: Secondary | ICD-10-CM

## 2022-01-11 LAB — URINALYSIS, ROUTINE W REFLEX MICROSCOPIC
Bacteria, UA: NONE SEEN
Bilirubin Urine: NEGATIVE
Glucose, UA: NEGATIVE mg/dL
Ketones, ur: NEGATIVE mg/dL
Leukocytes,Ua: NEGATIVE
Nitrite: NEGATIVE
Protein, ur: 100 mg/dL — AB
Specific Gravity, Urine: 1.03 (ref 1.005–1.030)
pH: 5 (ref 5.0–8.0)

## 2022-01-11 LAB — LACTIC ACID, PLASMA: Lactic Acid, Venous: 2.4 mmol/L (ref 0.5–1.9)

## 2022-01-11 LAB — BASIC METABOLIC PANEL
Anion gap: 10 (ref 5–15)
BUN: 29 mg/dL — ABNORMAL HIGH (ref 6–20)
CO2: 21 mmol/L — ABNORMAL LOW (ref 22–32)
Calcium: 7.6 mg/dL — ABNORMAL LOW (ref 8.9–10.3)
Chloride: 105 mmol/L (ref 98–111)
Creatinine, Ser: 1.09 mg/dL (ref 0.61–1.24)
GFR, Estimated: >60 mL/min (ref >60)
Glucose, Bld: 168 mg/dL — ABNORMAL HIGH (ref 70–99)
Potassium: 4.8 mmol/L (ref 3.5–5.1)
Sodium: 136 mmol/L (ref 135–145)

## 2022-01-11 LAB — HIV ANTIBODY (ROUTINE TESTING W REFLEX): HIV Screen 4th Generation wRfx: NONREACTIVE

## 2022-01-11 LAB — GROUP A STREP BY PCR: Group A Strep by PCR: NOT DETECTED

## 2022-01-11 MED ORDER — ALBUTEROL SULFATE HFA 108 (90 BASE) MCG/ACT IN AERS: 1.0000 | INHALATION_SPRAY | Freq: Four times a day (QID) | RESPIRATORY_TRACT | Status: DC | PRN

## 2022-01-11 MED ORDER — ALBUTEROL SULFATE (2.5 MG/3ML) 0.083% IN NEBU: 2.5000 mg | INHALATION_SOLUTION | Freq: Four times a day (QID) | RESPIRATORY_TRACT | Status: DC | PRN

## 2022-01-11 MED ORDER — ACETAMINOPHEN 325 MG PO TABS: 650.0000 mg | ORAL_TABLET | Freq: Four times a day (QID) | ORAL | Status: DC | PRN

## 2022-01-11 MED ORDER — LIDOCAINE VISCOUS HCL 2 % MT SOLN
15.0000 mL | Freq: Once | OROMUCOSAL | Status: AC
  Administered 2022-01-11: 15 mL via OROMUCOSAL
  Filled 2022-01-11: qty 15

## 2022-01-11 MED ORDER — LACTATED RINGERS IV BOLUS
1000.0000 mL | Freq: Once | INTRAVENOUS | Status: AC
  Administered 2022-01-11: 1000 mL via INTRAVENOUS

## 2022-01-11 MED ORDER — SODIUM CHLORIDE 0.9 % IV BOLUS
1000.0000 mL | Freq: Once | INTRAVENOUS | Status: AC
  Administered 2022-01-11: 1000 mL via INTRAVENOUS

## 2022-01-11 MED ORDER — ACETAMINOPHEN 650 MG RE SUPP: 650.0000 mg | Freq: Four times a day (QID) | RECTAL | Status: DC | PRN

## 2022-01-11 MED ORDER — ENOXAPARIN SODIUM 60 MG/0.6ML IJ SOSY
50.0000 mg | PREFILLED_SYRINGE | INTRAMUSCULAR | Status: DC
  Administered 2022-01-11 – 2022-01-12 (×2): 50 mg via SUBCUTANEOUS
  Filled 2022-01-11 (×2): qty 0.6

## 2022-01-11 NOTE — ED Notes (Signed)
VERBAL REPORT GIVEN TO [facility] RN AT THIS TIME

## 2022-01-11 NOTE — H&P (Cosign Needed Addendum)
Date: 01/11/2022               Patient Name:  Billy Pace MRN: [id number]  DOB: 09-08-1980 Age / Sex: 41 y.o., male   PCP: Jackie Plum, MD         Medical Service: Internal Medicine Teaching Service         Attending Physician: Dr. Dickie La, MD    First Contact: Dr. Neldon Labella Pager: [phone]  Second Contact: Dr. Champ Mungo  Pager: [phone]       After Hours (After 5p/  First Contact Pager: [phone]  weekends / holidays): Second Contact Pager: [phone]   Chief Complaint: Vomiting  History of Present Illness:  Billy Pace is a 41 year old person with history of asthma who was recently seen in ED for paronychia and underwent I&D on 11/8 who presents today for nausea, vomiting, and dehydration. States he was started on Bactrim on 11/9 due to left index finger infection. Noted 1 episode of emesis shortly after starting bactrim and since has had daily episodes of emesis since then. Emesis has been nonbloody and non bilious. He tried eating and drinking less to help with this, but continued to take Bactrim. Emesis worsened in the last 2 days with prolonged length of emesis prompting him to go to the ED. Notes his mouth started feeling very dried and cracked about 2 days ago. Has associated bleeding from surrounding oral mucosa. He did not note any initial lesion. Having pain and difficulty speaking due oral pain. Notes he now feels very thirsty and tired. Left finger pain and swelling have resolved.   He notes similar episode several years ago but cannot recall any details. He does not take any regular medications. Took bactirm between 11/8-11/14. Took ibuprofen for a few days but has not used this in the last few days as finger pain has resolved. Denies any smoking or drug use. Does not recall allergies to any medications in the past. He denies fever, chills, cough, shortness of breath, chest pain, rash, abdominal pain, nausea, vomiting, diarrhea, hematochezia, melena, syncope,  vision changes or urinary changes.   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 Denies tobacco, alcohol, or illicit drug use.  Review of Systems: A complete ROS was negative except as per HPI.   Physical Exam: Blood pressure 120/78, pulse 94, temperature 98.4 F (36.9 C), temperature source Oral, resp. rate 16, SpO2 94 %. Physical Exam Constitutional:      Appearance: He is obese.     Comments: Tired appearing  HENT:     Head: Normocephalic and atraumatic.     Mouth/Throat:     Mouth: Mucous membranes are dry.     Comments: Cracking of the lips dried blood around the lops, no active bleeding, thick white pseudomembrane of the tongue with underlying erythema, no vesicles, no lesion of the buccal mucosa Eyes:     Extraocular Movements: Extraocular movements intact.     Conjunctiva/sclera: Conjunctivae normal.     Pupils: Pupils are equal, round, and reactive to light.  Cardiovascular:     Rate and Rhythm: Regular rhythm. Tachycardia present.     Heart sounds: No murmur heard.    No friction rub. No gallop.  Abdominal:     General: Abdomen is flat. Bowel sounds are normal. There is no distension.     Palpations: Abdomen is soft. There is no mass.     Tenderness: There is no abdominal tenderness. There is no right CVA tenderness or left CVA tenderness.  Musculoskeletal:        General: Normal range of motion.     Cervical back: Normal range of motion. No rigidity.     Left lower leg: No edema.     Comments: Left index finger appears well healed, no swelling or signs of  infection, normal cap refill and sensation  Lymphadenopathy:     Cervical: No cervical adenopathy.  Skin:    General: Skin is warm.     Coloration: Skin is not jaundiced.     Findings: No bruising.     Comments: No rash outside oral lesions  Neurological:     General: No focal deficit present.     Mental Status: He is alert and oriented to person, place, and time.  Psychiatric:        Mood and Affect: Mood normal.        Behavior: Behavior normal.    EKG: personally reviewed my interpretation is sinus tachycardia, Rate 132no acute ischemic changes  CXR: personally reviewed my interpretation is low lung volumes, no focal infiltrates, no obvious pleural effusion, no pneumothorax  Assessment & Plan by Problem: Principal Problem:   Dehydration  Dehydration Oral mucosal bleeding  Noted to be tachycardic with mildly elevated temperature of 100.85F on arrival to ED. Poor PO intake in the setting of nausea and vomiting after starting bactrim after left finger I&D. No leukocytosis. No obvious infectious source. Mild elevation liver enzymes. Mildly elevated lactic acid of 3.3 that improved with IVF. Suspect this is in the setting of recent bactrim use. Continues to appear dehydrated. Oral lesion may be due to drug reaction to bactrim. SJS is a possibility, however no other cutaneous involvement. Appears he was prescribed bactrim in 2012 he cannot recall if he had a reaction at that time. - LR 1L bolus -Stop bactrim - Tylenol PRN - follow blood cultures, UA - encourage PO intake  AKI Elevated sCr of 1.39 in the setting of dehydration. Note decreased urine production at home with improvement after IVF. - IVF as above - check BMP  - monitor I/Os  Hyponatremia Na 130 on admission. Suspect hypovolemic hyponatremia in setting of poor oral intake and emesis.  Received 2L IVF in ED. Continue to appear dehydrated on exam. -LR 1L bolus -Repeat BMP  Left finger paronychia Patient thinks this  may be due to biting finger nails. Had incision and drainage on 11/8 and started on bactrim. Infection appears resolved and well healed. Xray of left finger wnl  Asthma Reports history of asthma. Does not seem to have PFTs in the past. On albuterol PRN at home and has not needed it recently. -albuterol PRN  Diet: Regular Fluids: LR bolus Code: Full DVT ppx: lovenox  Dispo: Admit patient to Observation with expected length of stay less than 2 midnights.  Signed: Quincy Simmonds, MD 01/11/2022, 9:31 AM  Pager: [phone] After 5pm on weekdays and 1pm on weekends: On Call pager: [phone]

## 2022-01-11 NOTE — ED Provider Notes (Signed)
[facility] EMERGENCY DEPARTMENT Provider Note   CSN: [id number] Arrival date & time: 01/10/22  1656     History  Chief Complaint  Patient presents with   Nausea   Wound Infection    Billy Pace is a 41 y.o. male.  The history is provided by the patient and medical records. No language interpreter was used.     41 year old male significant history of rectal abscess, previously seen by me approximately 8 days ago for a left index paronychia that I have incised and drained and prescribed Bactrim.  He is here with complaints of nausea and vomiting.  Patient mention since he started on Bactrim, he has had persistent nausea and have been vomiting.  Vomiting is intermittent, feels like he cannot keep anything down and feels dehydrated.  He feels weak and lightheadedness from not being able to keep fluid down.  He feels his mouth is dry.  He did try to finish the rest of his antibiotic but unable to.  He does not endorse worsening pain to his left index finger.  Does not endorse any headache, runny nose sneezing or coughing neck pain chest pain trouble breathing abdominal pain back pain dysuria or new rash.  He does not smoke or drink.  Home Medications Prior to Admission medications   Medication Sig Start Date End Date Taking? Authorizing Provider  albuterol (VENTOLIN HFA) 108 (90 Base) MCG/ACT inhaler Inhale 1-2 puffs into the lungs every 6 (six) hours as needed for wheezing or shortness of breath. 09/13/18   Jacqlyn Larsen, PA-C  cetirizine (ZYRTEC) 10 MG tablet Take 1 tablet (10 mg total) by mouth daily. 09/13/18   Jacqlyn Larsen, PA-C  Cholecalciferol (VITAMIN D PO) Take 1 capsule by mouth daily.    [provider]  diphenhydrAMINE (BENADRYL) 25 MG tablet Take 1 tablet (25 mg total) by mouth at bedtime as needed. Patient taking differently: Take 25 mg by mouth at bedtime as needed for sleep.  05/30/16   Clent Demark, PA-C  fluticasone [facility]) 50 MCG/ACT  nasal spray Place 2 sprays into both nostrils daily. 09/13/18   Jacqlyn Larsen, PA-C  ibuprofen (ADVIL) 600 MG tablet Take 1 tablet (600 mg total) by mouth every 6 (six) hours as needed. 01/04/22   Domenic Moras, PA-C  MELATONIN PO Take 1 tablet by mouth as needed (sleep).    [provider]  Multiple Vitamin (MULTIVITAMIN) tablet Take 1 tablet by mouth daily.    [provider]  sulfamethoxazole-trimethoprim (BACTRIM DS) 800-160 MG tablet Take 1 tablet by mouth 2 (two) times daily for 7 days. 01/04/22 01/11/22  Domenic Moras, PA-C  loratadine (CLARITIN) 10 MG tablet Take 1 tablet (10 mg total) by mouth daily. Patient not taking: Reported on 07/30/2017 05/30/16 03/18/20  Clent Demark, PA-C      Allergies    Patient has no known allergies.    Review of Systems   Review of Systems  All other systems reviewed and are negative.   Physical Exam Updated Vital Signs BP (!) 111/52   Pulse (!) 111   Temp 99.3 F (37.4 C) (Oral)   Resp 16   SpO2 93%  Physical Exam Vitals and nursing note reviewed.  Constitutional:      General: He is not in acute distress.    Appearance: He is well-developed.  HENT:     Head: Atraumatic.     Mouth/Throat:     Comments: Mouth is dry, yellow coating  noted on tongue, lips with dried blood noted.  No tonsillar enlargement or exudates no trismus. Eyes:     Conjunctiva/sclera: Conjunctivae normal.  Cardiovascular:     Rate and Rhythm: Tachycardia present.     Pulses: Normal pulses.     Heart sounds: Normal heart sounds.  Pulmonary:     Effort: Pulmonary effort is normal.     Breath sounds: Normal breath sounds. No wheezing or rhonchi.  Abdominal:     Palpations: Abdomen is soft.     Tenderness: There is no abdominal tenderness.  Musculoskeletal:     Cervical back: Neck supple.  Skin:    General: Skin is dry.     Capillary Refill: Capillary refill takes less than 2 seconds.     Findings: No rash.     Comments: Left index finger:  Dressing removed by me.  Well-healing site of previous paronychia, no erythema edema or warmth noted.  No joint tenderness.  Area nontender to palpation.  Neurological:     Mental Status: He is alert and oriented to person, place, and time.     ED Results / Procedures / Treatments   Labs (all labs ordered are listed, but only abnormal results are displayed) Labs Reviewed  CBC WITH DIFFERENTIAL/PLATELET - Abnormal; Notable for the following components:      Result Value   Hemoglobin 17.6 (*)    nRBC 0.3 (*)    Abs Immature Granulocytes 0.10 (*)    All other components within normal limits  COMPREHENSIVE METABOLIC PANEL - Abnormal; Notable for the following components:   Sodium 130 (*)    CO2 18 (*)    Glucose, Bld 217 (*)    BUN 36 (*)    Creatinine, Ser 1.39 (*)    Calcium 7.1 (*)    Total Protein 5.5 (*)    Albumin 2.0 (*)    AST 52 (*)    ALT 53 (*)    Alkaline Phosphatase 31 (*)    All other components within normal limits  LACTIC ACID, PLASMA - Abnormal; Notable for the following components:   Lactic Acid, Venous 3.3 (*)    All other components within normal limits  LACTIC ACID, PLASMA - Abnormal; Notable for the following components:   Lactic Acid, Venous 2.4 (*)    All other components within normal limits  RESP PANEL BY RT-PCR (FLU A&B, COVID) [id number]  CULTURE, BLOOD (ROUTINE X 2)  CULTURE, BLOOD (ROUTINE X 2)  GROUP A STREP BY PCR  URINALYSIS, ROUTINE W REFLEX MICROSCOPIC    EKG EKG Interpretation  Date/Time:  Tuesday January 10 2022 17:28:36 EST Ventricular Rate:  132 PR Interval:  128 QRS Duration: 72 QT Interval:  306 QTC Calculation: 453 R Axis:   -14 Text Interpretation: Sinus tachycardia Minimal voltage criteria for LVH, may be normal variant ( R in aVL ) Borderline ECG When compared with ECG of 13-Sep-2018 13:16, PREVIOUS ECG IS PRESENT Confirmed by Thayer Jew [id number]) on 01/11/2022 6:12:31 AM  Radiology DG Finger Index Left  Result Date:  01/10/2022 CLINICAL DATA:  Infection to left index finger x1 week. EXAM: LEFT INDEX FINGER 2+V COMPARISON:  None Available. FINDINGS: There is no evidence of fracture or dislocation. No cortical break or periosteal reaction identified. There is no evidence of arthropathy or other focal bone abnormality. No radiopaque foreign body. IMPRESSION: No radiographic evidence of osteomyelitis or radiopaque foreign body. Electronically Signed   By: Dahlia Bailiff M.D.   On: 01/10/2022 18:30  Procedures .Critical Care  Performed by: Domenic Moras, PA-C Authorized by: Domenic Moras, PA-C   Critical care provider statement:    Critical care time (minutes):  30   Critical care was time spent personally by me on the following activities:  Development of treatment plan with patient or surrogate, discussions with consultants, evaluation of patient's response to treatment, examination of patient, ordering and review of laboratory studies, ordering and review of radiographic studies, ordering and performing treatments and interventions, pulse oximetry, re-evaluation of patient's condition and review of old charts     Medications Ordered in ED Medications  lidocaine (XYLOCAINE) 2 % viscous mouth solution 15 mL (has no administration in time range)  sodium chloride 0.9 % bolus 1,000 mL (0 mLs Intravenous Stopped 01/11/22 0624)  sodium chloride 0.9 % bolus 1,000 mL (1,000 mLs Intravenous New Bag/Given 01/11/22 3716)    ED Course/ Medical Decision Making/ A&P                           Medical Decision Making Amount and/or Complexity of Data Reviewed Radiology: ordered.   BP (!) 111/52   Pulse (!) 111   Temp 99.3 F (37.4 C) (Oral)   Resp 16   SpO2 93%   75:91 AM 41 year old male significant history of rectal abscess, previously seen by me approximately 7 days ago for a left index paronychia that I have incised and drained and prescribed Bactrim.  He is here with complaints of nausea and vomiting.  Patient  mention since he started on Bactrim, he has had persistent nausea and have been vomiting.  Vomiting is intermittent, feels like he cannot keep anything down and feels dehydrated.  He feels weak and lightheadedness from not being able to keep fluid down.  He feels his mouth is dry.  He did try to finish the rest of his antibiotic but unable to. He does not endorse worsening pain to his left index finger.  Does not endorse any headache, runny nose sneezing or coughing neck pain chest pain trouble breathing abdominal pain back pain dysuria or new rash.  He does not smoke or drink.  On exam this is AA male laying in bed having difficulty talking due to hoarseness.  Mouth exam is remarkable for dry mouth with cracking skin and a yellow coating noted on his tongue.  His oropharynx are normal in appearance.  He does not have any nuchal rigidity, heart with tachycardia but no murmur rubs or gallops, lungs are clear on auscultation, abdomen is soft nontender, no splenomegaly or hepatomegaly appreciated.  He is moving all extremities with intact distal pulses.  He is mentating appropriately.  Current vital signs remarkable for heart rate of 111, and a temperature of 99.3.  He was 100.6 initially which improved with Tylenol.  He initially had a heart rate of 130 which improved with IV fluid.  Patient symptoms concerning for Katherina Right syndrome secondary to recent Bactrim use.  Patient has evidence of kidney and liver injury with a creatinine of 1.39, BUN of 36 and he has mildly elevated AST of 52, ALT 53, and alk phos of 31.  His lactic acid is 3.3 likely reflects dehydration and less likely to be infectious.  An x-ray of his left index finger shows no evidence of osteomyelitis.  -Labs ordered, independently viewed and interpreted by me.  Labs remarkable for normal WBC, elevated lactic acid of 3.3 likely 2/2 dehydration, BUN 36, Cr 1.39.  IVF  given.  Mild transaminitis os AST 52, ALT 53. -The patient was  maintained on a cardiac monitor.  I personally viewed and interpreted the cardiac monitored which showed an underlying rhythm of: sinus tachycardia -Imaging independently viewed and interpreted by me and I agree with radiologist's interpretation.  Result remarkable for L index finger xray showing no acute changes -This patient presents to the ED for concern of nausea and weakness, this involves an extensive number of treatment options, and is a complaint that carries with it a high risk of complications and morbidity.  The differential diagnosis includes SJS, TEN, viral illness, L osteomyelitis, pna, uti, abdominal pathology -Co morbidities that complicate the patient evaluation includes none -Treatment includes IVF -Reevaluation of the patient after these medicines showed that the patient improved -PCP office notes or outside notes reviewed -Discussion with Internal Medicine Teaching Service resident who agrees to see and will admit pt -Social Determinant of Health considered   8:35 AM Suspect symptoms likely secondary to [employer] syndrome as patient manifests mucopurulent skin changes to his mouth without significant desquamation.  He does not have any other skin rash.  Symptoms not consistent with TEN.  He is improved with IV fluid, lactic acid improved as well, vital signs stabilized.  He is no longer tachycardic and is resting comfortably.  Patient given viscous lidocaine for mouth discomfort.  I appreciate consultation from internal medicine resident who agrees to see and will admit patient for observation and supportive care.        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

## 2022-01-11 NOTE — ED Notes (Signed)
ED TO INPATIENT HANDOFF REPORT  ED Nurse Name and Phone #:    S Name/Age/Gender Billy Pace 41 y.o. male Room/Bed: H020C/H020C  Code Status   Code Status: Full Code  Home/SNF/Other Home Patient oriented to: self, place, time, and situation Is this baseline? Yes   Triage Complete: Triage compleOrlie Pace  Chief Complaint Dehydration [E86.0]  Triage Note Pt states he was seen approximately one week ago for infection to L index finger. Given abx at that time. Since then pt has had frequent vomiting, poor PO intake and lethargy. Pt tachycardic in 130's during triage. Denies any abd pain, CP, SOB.    Allergies No Known Allergies  Level of Care/Admitting Diagnosis ED Disposition     ED Disposition  Admit   Condition  --   Comment  [facility] [[id number]]  Level of Care: Med-Surg [16]  [facility] if equivalent level of care is available:: No  Covid Evaluation: Asymptomatic - no recent exposure (last 10 days) testing not required  Diagnosis: Dehydration [276.51.ICD-9-CM]  Admitting Physician: Dickie LaLAU, GRACE [[id number]]  Attending Physician: Dickie LaLAU, GRACE [[id number]]          B Medical/Surgery History Past Medical History:  Diagnosis Date   Rectal abscess    History reviewed. No pertinent surgical history.   A IV Location/Drains/Wounds Patient Lines/Drains/Airways Status     Active Line/Drains/Airways     Name Placement date Placement time Site Days   Peripheral IV 01/10/22 20 G Left;Posterior Hand 01/10/22  1657  Hand  1   Peripheral IV 01/11/22 20 G Right Antecubital 01/11/22  0643  Antecubital  less than 1            Intake/Output Last 24 hours No intake or output data in the 24 hours ending 01/11/22 1341  Labs/Imaging Results for orders placed or performed during the hospital encounter of 01/10/22 (from the past 48 hour(s))  CBC with Differential     Status: Abnormal   Collection  Time: 01/10/22  5:56 PM  Result Value Ref Range   WBC 6.1 4.0 - 10.5 K/uL   RBC 5.74 4.22 - 5.81 MIL/uL   Hemoglobin 17.6 (H) 13.0 - 17.0 g/dL   HCT 19.150.1 47.839.0 - 29.552.0 %   MCV 87.3 80.0 - 100.0 fL   MCH 30.7 26.0 - 34.0 pg   MCHC 35.1 30.0 - 36.0 g/dL   RDW 62.112.7 30.811.5 - 65.715.5 %   Platelets 285 150 - 400 K/uL   nRBC 0.3 (H) 0.0 - 0.2 %   Neutrophils Relative % 51 %   Neutro Abs 3.8 1.7 - 7.7 K/uL   Band Neutrophils 11 %   Lymphocytes Relative 26 %   Lymphs Abs 1.6 0.7 - 4.0 K/uL   Monocytes Relative 9 %   Monocytes Absolute 0.5 0.1 - 1.0 K/uL   Eosinophils Relative 1 %   Eosinophils Absolute 0.1 0.0 - 0.5 K/uL   Basophils Relative 0 %   Basophils Absolute 0.0 0.0 - 0.1 K/uL   WBC Morphology DOHLE BODIES     Comment: MILD LEFT SHIFT (1-5% METAS, OCC MYELO, OCC BANDS) SMUDGE CELLS    RBC Morphology MORPHOLOGY UNREMARKABLE    Smear Review Normal platelet morphology    Metamyelocytes Relative 1 %   Myelocytes 1 %   Abs Immature Granulocytes 0.10 (H) 0.00 - 0.07 K/uL    Comment: Performed at [facility], [address] [place],  [place] [address]  Comprehensive metabolic panel     Status: Abnormal   Collection Time: 01/10/22  5:56 PM  Result Value Ref Range   Sodium 130 (L) 135 - 145 mmol/L   Potassium 4.4 3.5 - 5.1 mmol/L   Chloride 101 98 - 111 mmol/L   CO2 18 (L) 22 - 32 mmol/L   Glucose, Bld 217 (H) 70 - 99 mg/dL    Comment: Glucose reference range applies only to samples taken after fasting for at least 8 hours.   BUN 36 (H) 6 - 20 mg/dL   Creatinine, Ser 4.78 (H) 0.61 - 1.24 mg/dL   Calcium 7.1 (L) 8.9 - 10.3 mg/dL   Total Protein 5.5 (L) 6.5 - 8.1 g/dL   Albumin 2.0 (L) 3.5 - 5.0 g/dL   AST 52 (H) 15 - 41 U/L   ALT 53 (H) 0 - 44 U/L   Alkaline Phosphatase 31 (L) 38 - 126 U/L   Total Bilirubin 0.6 0.3 - 1.2 mg/dL   GFR, Estimated >29 >56 mL/min    Comment: (NOTE) Calculated using the CKD-EPI Creatinine Equation (2021)    Anion gap 11 5 - 15    Comment:  Performed at [facility], [address], [place], [place] [address]  Lactic acid, plasma     Status: Abnormal   Collection Time: 01/10/22  5:56 PM  Result Value Ref Range   Lactic Acid, Venous 3.3 (HH) 0.5 - 1.9 mmol/L    Comment: CRITICAL RESULT CALLED TO, READ BACK BY AND VERIFIED WITH B. MONTY RN 01/10/2022 1900 B NUNNERY Performed at [facility], [address], [place], [place] [address]   Resp Panel by RT-PCR (Flu A&B, Covid) Anterior Nasal Swab     Status: None   Collection Time: 01/10/22  5:57 PM   Specimen: Anterior Nasal Swab  Result Value Ref Range   SARS Coronavirus 2 by RT PCR NEGATIVE NEGATIVE    Comment: (NOTE) SARS-CoV-2 target nucleic acids are NOT DETECTED.  The SARS-CoV-2 RNA is generally detectable in upper respiratory specimens during the acute phase of infection. The lowest concentration of SARS-CoV-2 viral copies this assay can detect is 138 copies/mL. A negative result does not preclude SARS-Cov-2 infection and should not be used as the sole basis for treatment or other patient management decisions. A negative result may occur with  improper specimen collection/handling, submission of specimen other than nasopharyngeal swab, presence of viral mutation(s) within the areas targeted by this assay, and inadequate number of viral copies(<138 copies/mL). A negative result must be combined with clinical observations, patient history, and epidemiological information. The expected result is Negative.  Fact Sheet for Patients:  BloggerCourse.com  Fact Sheet for Healthcare Providers:  SeriousBroker.it  This test is no t yet approved or cleared by the [place] FDA and  has been authorized for detection and/or diagnosis of SARS-CoV-2 by FDA under an Emergency Use Authorization (EUA). This EUA will remain  in effect (meaning this test can be used) for the duration of the COVID-19 declaration under  Section 564(b)(1) of the Act, 21 U.S.C.section 360bbb-3(b)(1), unless the authorization is terminated  or revoked sooner.       Influenza A by PCR NEGATIVE NEGATIVE   Influenza B by PCR NEGATIVE NEGATIVE    Comment: (NOTE) The Xpert Xpress SARS-CoV-2/FLU/RSV plus assay is intended as an aid in the diagnosis of influenza from Nasopharyngeal swab specimens and should not be used as a sole basis for treatment. Nasal washings  and aspirates are unacceptable for Xpert Xpress SARS-CoV-2/FLU/RSV testing.  Fact Sheet for Patients: BloggerCourse.com  Fact Sheet for Healthcare Providers: SeriousBroker.it  This test is not yet approved or cleared by the [place] FDA and has been authorized for detection and/or diagnosis of SARS-CoV-2 by FDA under an Emergency Use Authorization (EUA). This EUA will remain in effect (meaning this test can be used) for the duration of the COVID-19 declaration under Section 564(b)(1) of the Act, 21 U.S.C. section 360bbb-3(b)(1), unless the authorization is terminated or revoked.  Performed at [facility], [address] [place], [place] [address]   Blood culture (routine x 2)     Status: None (Preliminary result)   Collection Time: 01/10/22  5:57 PM   Specimen: BLOOD  Result Value Ref Range   Specimen Description BLOOD RIGHT ANTECUBITAL    Special Requests      BOTTLES DRAWN AEROBIC AND ANAEROBIC Blood Culture results may not be optimal due to an inadequate volume of blood received in culture bottles   Culture      NO GROWTH < 24 HOURS Performed at [facility], [address], [place], [place] [address]    Report Status PENDING   Lactic acid, plasma     Status: Abnormal   Collection Time: 01/11/22  6:40 AM  Result Value Ref Range   Lactic Acid, Venous 2.4 (HH) 0.5 - 1.9 mmol/L    Comment: CRITICAL RESULT CALLED TO, READ BACK BY AND VERIFIED WITH J.BLUE RN [phone] 01/11/22 MCCORMICK  K Performed at [facility], [address] [place], [place] [address]   Group A Strep by PCR     Status: None   Collection Time: 01/11/22  6:40 AM   Specimen: Throat; Sterile Swab  Result Value Ref Range   Group A Strep by PCR NOT DETECTED NOT DETECTED    Comment: Performed at [facility], [address] [place], [place] [address]   DG Chest 2 View  Result Date: 01/11/2022 CLINICAL DATA:  Sepsis and vomiting. EXAM: CHEST - 2 VIEW COMPARISON:  Portable chest 09/13/2018 FINDINGS: The heart size and mediastinal contours are within normal limits. Both lungs are hypoinflated with atelectatic bands in the lower zones but no convincing pneumonia. The visualized skeletal structures are unremarkable. IMPRESSION: Hypoinflated lungs with atelectatic bands in the lower zones but no convincing pneumonia. Follow-up study in full inspiration is suggested. Electronically Signed   By: Almira Bar M.D.   On: 01/11/2022 07:38   DG Finger Index Left  Result Date: 01/10/2022 CLINICAL DATA:  Infection to left index finger x1 week. EXAM: LEFT INDEX FINGER 2+V COMPARISON:  None Available. FINDINGS: There is no evidence of fracture or dislocation. No cortical break or periosteal reaction identified. There is no evidence of arthropathy or other focal bone abnormality. No radiopaque foreign body. IMPRESSION: No radiographic evidence of osteomyelitis or radiopaque foreign body. Electronically Signed   By: Maudry Mayhew M.D.   On: 01/10/2022 18:30    Pending Labs Unresulted Labs (From admission, onward)     Start     Ordered   01/12/22 0500  Comprehensive metabolic panel  Tomorrow morning,   R        01/11/22 0914   01/12/22 0500  CBC  Tomorrow morning,   R        01/11/22 0914   01/11/22 1000  Basic metabolic panel  Once,   R        01/11/22 0959  01/11/22 0914  HIV Antibody (routine testing w rflx)  (HIV Antibody (Routine testing w reflex) panel)  Once,   R        01/11/22 0914   01/10/22  1740  Urinalysis, Routine w reflex microscopic  Once,   URGENT        01/10/22 1739   01/10/22 1738  Blood culture (routine x 2)  BLOOD CULTURE X 2,   R (with STAT occurrences)      01/10/22 1739            Vitals/Pain Today's Vitals   01/11/22 1132 01/11/22 1132 01/11/22 1225 01/11/22 1227  BP:  108/75    Pulse:  93    Resp:  16    Temp:   98.2 F (36.8 C)   TempSrc:   Oral   SpO2:  94%    PainSc: Asleep   0-No pain    Isolation Precautions No active isolations  Medications Medications  enoxaparin (LOVENOX) injection 50 mg (50 mg Subcutaneous Given 01/11/22 1224)  acetaminophen (TYLENOL) tablet 650 mg (has no administration in time range)    Or  acetaminophen (TYLENOL) suppository 650 mg (has no administration in time range)  albuterol (PROVENTIL) (2.5 MG/3ML) 0.083% nebulizer solution 2.5 mg (has no administration in time range)  sodium chloride 0.9 % bolus 1,000 mL (0 mLs Intravenous Stopped 01/11/22 0624)  sodium chloride 0.9 % bolus 1,000 mL (0 mLs Intravenous Stopped 01/11/22 0916)  lidocaine (XYLOCAINE) 2 % viscous mouth solution 15 mL (15 mLs Mouth/Throat Given 01/11/22 0914)  lactated ringers bolus 1,000 mL (1,000 mLs Intravenous New Bag/Given 01/11/22 1227)    Mobility walks Low fall risk   Focused Assessments    R Recommendations: See Admitting Provider Note  Report given to:   Additional Notes:

## 2022-01-12 DIAGNOSIS — E860 Dehydration: Secondary | ICD-10-CM | POA: Diagnosis not present

## 2022-01-12 LAB — COMPREHENSIVE METABOLIC PANEL
ALT: 45 U/L — ABNORMAL HIGH (ref 0–44)
AST: 41 U/L (ref 15–41)
Albumin: 1.8 g/dL — ABNORMAL LOW (ref 3.5–5.0)
Alkaline Phosphatase: 30 U/L — ABNORMAL LOW (ref 38–126)
Anion gap: 6 (ref 5–15)
BUN: 24 mg/dL — ABNORMAL HIGH (ref 6–20)
CO2: 23 mmol/L (ref 22–32)
Calcium: 7.2 mg/dL — ABNORMAL LOW (ref 8.9–10.3)
Chloride: 101 mmol/L (ref 98–111)
Creatinine, Ser: 0.86 mg/dL (ref 0.61–1.24)
GFR, Estimated: >60 mL/min (ref >60)
Glucose, Bld: 131 mg/dL — ABNORMAL HIGH (ref 70–99)
Potassium: 4 mmol/L (ref 3.5–5.1)
Sodium: 130 mmol/L — ABNORMAL LOW (ref 135–145)
Total Bilirubin: 0.7 mg/dL (ref 0.3–1.2)
Total Protein: 4.8 g/dL — ABNORMAL LOW (ref 6.5–8.1)

## 2022-01-12 LAB — CBC
HCT: 44 % (ref 39.0–52.0)
Hemoglobin: 15.3 g/dL (ref 13.0–17.0)
MCH: 30.5 pg (ref 26.0–34.0)
MCHC: 34.8 g/dL (ref 30.0–36.0)
MCV: 87.6 fL (ref 80.0–100.0)
Platelets: 267 10*3/uL (ref 150–400)
RBC: 5.02 MIL/uL (ref 4.22–5.81)
RDW: 12.8 % (ref 11.5–15.5)
WBC: 7.3 10*3/uL (ref 4.0–10.5)
nRBC: 0 % (ref 0.0–0.2)

## 2022-01-12 NOTE — Hospital Course (Addendum)
11/16: feels dehydrated despite drinking a lot of water. Thinks his voice is more hoarse that now. Is thirsty constantly. Has bene able to keep it down without vomit. Wants more ivf. Still weak and lightheaded but hasn't gotten out of bed except once when he got him to go to bathroom sometime this morning around 7am maybe. Tired. Overall better than he came in. Doesn't have same feeling of pain in lips as when he came in. Has not thrown up. Hasn't tried eating, hasn't had any appetite. Last time he had regular food intake was last week. Prior to starting vomiting no new or odd PO intake, no sick contacts or illness himself.

## 2022-01-12 NOTE — Progress Notes (Incomplete)
   Subjective:  The patient denies any further vomiting today. He still feels dehydrated and thirsty, but has been able to tolerate water by mouth without nausea and vomiting. The patient reports improved weakness and lightheadedness. He was able to ambulate to the bathroom this morning with no difficulty.   Objective:  Vital signs in last 24 hours: Vitals:   01/11/22 2017 01/12/22 0221 01/12/22 0827 01/12/22 1036  BP: 118/78 101/60 118/72   Pulse: 96 93 90   Resp: 18 20 17    Temp: 98.6 F (37 C) 97.9 F (36.6 C) 97.8 F (36.6 C)   TempSrc: Oral Oral Oral   SpO2: 98% 97% 96%   Weight:    108.9 kg  Height:    6\' 2"  (1.88 m)   Weight change:   Intake/Output Summary (Last 24 hours) at 01/12/2022 1046 Last data filed at 01/12/2022 0600 Gross per 24 hour  Intake 480 ml  Output 1400 ml  Net -920 ml   General: Appears to be resting comfortably in bed; No acute distress HEENT: Dry, cracked lips with peeling skin and small amount of dried blood; Yellow/white pseudomembrane to tongue with underlying erythema; No vesicles; No lesions to oropharynx, hard palate, buccal mucosa  Cardiovascular: Pulmonary: Normal respiratory effort; Lungs clear to auscultation bilaterally; No wheezes, rales, or rhonchi Extremities: No lower extremity edema Skin: Warm and dry; No rashes outside of oral lesions     Assessment/Plan:  Principal Problem:   Dehydration   ***   LOS: 0 days   01/14/2022, Medical Student 01/12/2022, 10:46 AM

## 2022-01-12 NOTE — Discharge Summary (Addendum)
Name: GIOVANNI BIBY MRN: [id number] DOB: April 22, 1980 41 y.o. PCP: Jackie Plum, MD  Date of Admission: 01/10/2022  5:24 PM Date of Discharge: 01/12/2022 Attending Physician: Dickie La, MD  Discharge Diagnosis: 1. Principal Problem:   Dehydration   Discharge Medications: Allergies as of 01/12/2022       Reactions   Bactrim [sulfamethoxazole-trimethoprim] Nausea And Vomiting, Rash   Patient had N/V a few hours after taking Bactrim that worsened after continued use of the drug. He also developed a dry, tender rash localized to his mouth.         Medication List     STOP taking these medications    cetirizine 10 MG tablet Commonly known as: ZYRTEC   fluticasone 50 MCG/ACT nasal spray Commonly known as: FLONASE   sulfamethoxazole-trimethoprim 800-160 MG tablet Commonly known as: BACTRIM DS       TAKE these medications    acetaminophen 500 MG tablet Commonly known as: TYLENOL Take 1,000 mg by mouth every 6 (six) hours as needed for moderate pain.   albuterol 108 (90 Base) MCG/ACT inhaler Commonly known as: VENTOLIN HFA Inhale 1-2 puffs into the lungs every 6 (six) hours as needed for wheezing or shortness of breath.   ibuprofen 600 MG tablet Commonly known as: ADVIL Take 1 tablet (600 mg total) by mouth every 6 (six) hours as needed. What changed:  when to take this reasons to take this   multivitamin with minerals tablet Take 1 tablet by mouth daily.   TUMS PO Take 2-3 tablets by mouth daily as needed (heartburn).        Disposition and follow-up:   Mr.Adonys P Fickle was discharged from [facility] in Stable condition.  At the hospital follow up visit please address:  Please assess hydration status and skin for any developing rashes.  Please order CMP to evaluate for resolution of hyponatremia, monitor creatinine, and assess for improvement after albumin/total protein. Please reassess left index finger paronychia. Labs /  imaging needed at time of follow-up: CMP Pending labs/ test needing follow-up: none  Follow-up Appointments:  Follow-up Information     Osei-Bonsu, Greggory Stallion, MD Follow up in 1 week(s).   Specialty: Internal Medicine Contact information: [address] [place] [address] [phone]                 Hospital Course by problem list:  Dehydration: The patient presented to the ED on Tuesday, 11/14, for persistent nausea and vomiting since starting a course of Bactrim on 11/9 for left index paronychia.  He denied any chest pain, dyspnea, dysuria, diarrhea, or other skin lesion.  On exam, he was found to have cracked lips with dried blood, but no rashes to the rest of his skin. His left finger paronychia appeared to have resolved, and XR showed no osteomyelitis or abnormality. He was hemodynamically stable. His labs were notable for sodium of 130, creatinine of 1.39, albumin 2.0, AST 52, ALT 52, and lactic acid 3.3. He was given viscous lidocaine for mouth pain and two 1L boluses of NS. He was admitted for concern for drug-induced Stevens-Johnson syndrome, but this was felt to be less likely in absence of additional rashes or additional mouth lesions. We determined this was likely an adverse drug reaction to Bactrim causing nausea and vomiting leading to dehydration.  In addition to dehydration, he also has significant malnutrition due to poor p.o. intake over the past couple of weeks.  On admission, he received another  1L bolus of LR and Tylenol PRN for pain.  After discontinuation of Bactrim, he quickly resumed p.o. intake without any recurrent nausea or vomiting.  He was able to ambulate independently without assistance or symptoms.  We discussed expectations for slow advancement of diet over time in the outpatient setting and gradual increase in activity with close outpatient follow-up with his PCP.  He felt comfortable with discharge today.  AKI: On presentation, the patient  had an AKI with a creatinine of 1.39. This was presumed to be prerenal due to dehydration. After receiving a two 1L boluses of NS and a 1L of LR, his creatinine corrected to 0.89.   Hyponatremia: The patient's sodium was 130 on presentation due to hypovolemic hyponatremia. He received a total of 3 L of fluids.  Sodium still low afterwards at 130 without any residual symptoms.  He will follow-up with PCP for repeat CMP to confirm resolution.  Left finger paronychia: The patient presented to the ED on 11/8 for left index finger paronychia and was prescribed Bactrim. On exam, his left index finger appeared well healed, with no swelling or signs of infection. The patient was only able to complete 6 days of his antibiotic regimen, but his infection appears to have resolved.   Asthma: The patient was continued on his home albuterol PRN during admission. His asthma was stable and did not require any PRNs during admission.   Subjective:  The patient denies any further vomiting today. He still feels thirsty, but has been able to tolerate water and broth by mouth without nausea and vomiting. The patient reports improved weakness and lightheadedness. He was able to ambulate the halls without difficulty.  After discussion of plan, he was amenable to discharge today.  Discharge Exam:   BP 124/80 (BP Location: Left Arm)   Pulse 90   Temp 98.1 F (36.7 C) (Oral)   Resp 15   Ht 6\' 2"  (1.88 m)   Wt 108.9 kg   SpO2 98%   BMI 30.81 kg/m  Discharge exam:   General: Appears to be resting comfortably in bed; No acute distress HEENT: Dry, cracked lips with peeling skin and small amount of dried blood; Yellow/white discoloration of tongue with underlying erythema; No vesicles; No lesions to oropharynx, hard palate, buccal mucosa  Cardiovascular: Pulmonary: Normal respiratory effort; Lungs clear to auscultation bilaterally; No wheezes, rales, or rhonchi Extremities: : Left index finger appears well healed, no  swelling or signs of infection; No lower extremity edema Skin: Warm and dry; No rashes  Pertinent Labs, Studies, and Procedures:   DG Chest 2 View  Result Date: 01/11/2022 CLINICAL DATA:  Sepsis and vomiting. EXAM: CHEST - 2 VIEW COMPARISON:  Portable chest 09/13/2018 FINDINGS: The heart size and mediastinal contours are within normal limits. Both lungs are hypoinflated with atelectatic bands in the lower zones but no convincing pneumonia. The visualized skeletal structures are unremarkable. IMPRESSION: Hypoinflated lungs with atelectatic bands in the lower zones but no convincing pneumonia. Follow-up study in full inspiration is suggested. Electronically Signed   By: 09/15/2018 M.D.   On: 01/11/2022 07:38   DG Finger Index Left  Result Date: 01/10/2022 CLINICAL DATA:  Infection to left index finger x1 week. EXAM: LEFT INDEX FINGER 2+V COMPARISON:  None Available. FINDINGS: There is no evidence of fracture or dislocation. No cortical break or periosteal reaction identified. There is no evidence of arthropathy or other focal bone abnormality. No radiopaque foreign body. IMPRESSION: No radiographic evidence of osteomyelitis or radiopaque  foreign body. Electronically Signed   By: Maudry Mayhew M.D.   On: 01/10/2022 18:30        Latest Ref Rng & Units 01/12/2022    3:56 AM 01/10/2022    5:56 PM 09/13/2018    2:31 PM  CBC  WBC 4.0 - 10.5 K/uL 7.3  6.1  6.5   Hemoglobin 13.0 - 17.0 g/dL 49.7  02.6  37.8   Hematocrit 39.0 - 52.0 % 44.0  50.1  46.0   Platelets 150 - 400 K/uL 267  285  300        Latest Ref Rng & Units 01/12/2022    3:56 AM 01/11/2022   12:04 PM 01/10/2022    5:56 PM  CMP  Glucose 70 - 99 mg/dL 588  502  774   BUN 6 - 20 mg/dL 24  29  36   Creatinine 0.61 - 1.24 mg/dL 1.28  7.86  7.67   Sodium 135 - 145 mmol/L 130  136  130   Potassium 3.5 - 5.1 mmol/L 4.0  4.8  4.4   Chloride 98 - 111 mmol/L 101  105  101   CO2 22 - 32 mmol/L 23  21  18    Calcium 8.9 - 10.3 mg/dL  7.2  7.6  7.1   Total Protein 6.5 - 8.1 g/dL 4.8   5.5   Total Bilirubin 0.3 - 1.2 mg/dL 0.7   0.6   Alkaline Phos 38 - 126 U/L 30   31   AST 15 - 41 U/L 41   52   ALT 0 - 44 U/L 45   53   Lactic acid 3.3, 2.4 Blood cultures negative x48 hours  Discharge Instructions: Discharge Instructions     Discharge instructions   Complete by: As directed    Mr. Adelstein,  It was a pleasure to care for you during your stay at [facility]. You were admitted and treated for dehydration and we are thrilled that you are doing much better.  Make sure to increase the amount of food and fluids that you take in as you are able, especially foods with a lot of nutrients and water.  Please schedule a follow up appointment with your primary care provider soon.  My best, Dr. [facility]   Increase activity slowly   Complete by: As directed        Signed: August Saucer, Medical Student 01/12/2022, 4:53 PM     I have seen and examined the patient, and reviewed the discharge note by 01/14/2022, MS 3 and discussed the care of the patient with them.    Signed:  Vidal Schwalbe, MD 01/12/2022, 5:20 PM

## 2022-01-12 NOTE — Progress Notes (Signed)
Discharge instructions provided. Patient verbalized understanding. No acute distress noted. Patient off unit via wheelchair.  

## 2022-01-13 LAB — BLOOD CULTURE ID PANEL (REFLEXED) - BCID2

## 2022-01-14 LAB — CULTURE, BLOOD (ROUTINE X 2)

## 2022-01-16 ENCOUNTER — Telehealth: Payer: Self-pay | Admitting: Internal Medicine

## 2022-01-16 LAB — CULTURE, BLOOD (ROUTINE X 2)
Culture: NO GROWTH
Special Requests: ADEQUATE

## 2022-01-16 NOTE — Telephone Encounter (Signed)
Attempt to contact Billy Pace regarding recent lab results from hospital admission after several unsuccessful attempts by my colleague, Dr. Franciso Bend, were made. The call went directly to voicemail and there was no option to leave a message.  Champ Mungo, DO

## 2022-01-16 NOTE — Progress Notes (Signed)
I Jadiel't but I will follow up with him to ask if he did!

## 2022-01-17 ENCOUNTER — Inpatient Hospital Stay (HOSPITAL_COMMUNITY)
Admission: EM | Admit: 2022-01-17 | Discharge: 2022-01-20 | DRG: 392 | Disposition: A | Discharge status: Home / Self Care | Payer: Medicaid Other | Attending: Internal Medicine | Admitting: Internal Medicine

## 2022-01-17 ENCOUNTER — Emergency Department (HOSPITAL_COMMUNITY): Payer: Medicaid Other

## 2022-01-17 ENCOUNTER — Encounter (HOSPITAL_COMMUNITY): Payer: Self-pay

## 2022-01-17 ENCOUNTER — Other Ambulatory Visit: Payer: Self-pay

## 2022-01-17 DIAGNOSIS — E669 Obesity, unspecified: Secondary | ICD-10-CM | POA: Diagnosis present

## 2022-01-17 DIAGNOSIS — R7401 Elevation of levels of liver transaminase levels: Secondary | ICD-10-CM | POA: Diagnosis present

## 2022-01-17 DIAGNOSIS — D72829 Elevated white blood cell count, unspecified: Secondary | ICD-10-CM | POA: Insufficient documentation

## 2022-01-17 DIAGNOSIS — R197 Diarrhea, unspecified: Secondary | ICD-10-CM | POA: Diagnosis present

## 2022-01-17 DIAGNOSIS — Z79899 Other long term (current) drug therapy: Secondary | ICD-10-CM

## 2022-01-17 DIAGNOSIS — Z882 Allergy status to sulfonamides status: Secondary | ICD-10-CM

## 2022-01-17 DIAGNOSIS — E860 Dehydration: Secondary | ICD-10-CM | POA: Diagnosis present

## 2022-01-17 DIAGNOSIS — R112 Nausea with vomiting, unspecified: Principal | ICD-10-CM

## 2022-01-17 DIAGNOSIS — J45909 Unspecified asthma, uncomplicated: Secondary | ICD-10-CM | POA: Diagnosis present

## 2022-01-17 DIAGNOSIS — K529 Noninfective gastroenteritis and colitis, unspecified: Secondary | ICD-10-CM | POA: Diagnosis not present

## 2022-01-17 DIAGNOSIS — Z6830 Body mass index (BMI) 30.0-30.9, adult: Secondary | ICD-10-CM

## 2022-01-17 DIAGNOSIS — E871 Hypo-osmolality and hyponatremia: Secondary | ICD-10-CM | POA: Diagnosis present

## 2022-01-17 DIAGNOSIS — R339 Retention of urine, unspecified: Secondary | ICD-10-CM | POA: Diagnosis present

## 2022-01-17 DIAGNOSIS — A09 Infectious gastroenteritis and colitis, unspecified: Principal | ICD-10-CM | POA: Diagnosis present

## 2022-01-17 LAB — CBC WITH DIFFERENTIAL/PLATELET
Abs Immature Granulocytes: 0.14 10*3/uL — ABNORMAL HIGH (ref 0.00–0.07)
Basophils Absolute: 0.1 10*3/uL (ref 0.0–0.1)
Basophils Relative: 1 %
Eosinophils Absolute: 0 10*3/uL (ref 0.0–0.5)
Eosinophils Relative: 0 %
HCT: 53.4 % — ABNORMAL HIGH (ref 39.0–52.0)
Hemoglobin: 18.3 g/dL — ABNORMAL HIGH (ref 13.0–17.0)
Immature Granulocytes: 1 %
Lymphocytes Relative: 26 %
Lymphs Abs: 3.1 10*3/uL (ref 0.7–4.0)
MCH: 30 pg (ref 26.0–34.0)
MCHC: 34.3 g/dL (ref 30.0–36.0)
MCV: 87.4 fL (ref 80.0–100.0)
Monocytes Absolute: 1.6 10*3/uL — ABNORMAL HIGH (ref 0.1–1.0)
Monocytes Relative: 13 %
Neutro Abs: 7.2 10*3/uL (ref 1.7–7.7)
Neutrophils Relative %: 59 %
Platelets: 608 10*3/uL — ABNORMAL HIGH (ref 150–400)
RBC: 6.11 MIL/uL — ABNORMAL HIGH (ref 4.22–5.81)
RDW: 12.6 % (ref 11.5–15.5)
WBC: 12.2 10*3/uL — ABNORMAL HIGH (ref 4.0–10.5)
nRBC: 0 % (ref 0.0–0.2)

## 2022-01-17 LAB — COMPREHENSIVE METABOLIC PANEL
ALT: 68 U/L — ABNORMAL HIGH (ref 0–44)
AST: 36 U/L (ref 15–41)
Albumin: 1.8 g/dL — ABNORMAL LOW (ref 3.5–5.0)
Alkaline Phosphatase: 53 U/L (ref 38–126)
Anion gap: 10 (ref 5–15)
BUN: 17 mg/dL (ref 6–20)
CO2: 17 mmol/L — ABNORMAL LOW (ref 22–32)
Calcium: 7.3 mg/dL — ABNORMAL LOW (ref 8.9–10.3)
Chloride: 99 mmol/L (ref 98–111)
Creatinine, Ser: 0.94 mg/dL (ref 0.61–1.24)
GFR, Estimated: >60 mL/min (ref >60)
Glucose, Bld: 153 mg/dL — ABNORMAL HIGH (ref 70–99)
Potassium: 4.2 mmol/L (ref 3.5–5.1)
Sodium: 126 mmol/L — ABNORMAL LOW (ref 135–145)
Total Bilirubin: 0.9 mg/dL (ref 0.3–1.2)
Total Protein: 4.7 g/dL — ABNORMAL LOW (ref 6.5–8.1)

## 2022-01-17 LAB — URINALYSIS, ROUTINE W REFLEX MICROSCOPIC
Bilirubin Urine: NEGATIVE
Glucose, UA: NEGATIVE mg/dL
Ketones, ur: 20 mg/dL — AB
Leukocytes,Ua: NEGATIVE
Nitrite: NEGATIVE
Protein, ur: NEGATIVE mg/dL
Specific Gravity, Urine: 1.034 — ABNORMAL HIGH (ref 1.005–1.030)
pH: 5 (ref 5.0–8.0)

## 2022-01-17 LAB — C DIFFICILE QUICK SCREEN W PCR REFLEX
C Diff antigen: NEGATIVE
C Diff interpretation: NOT DETECTED
C Diff toxin: NEGATIVE

## 2022-01-17 LAB — LACTIC ACID, PLASMA: Lactic Acid, Venous: 1.3 mmol/L (ref 0.5–1.9)

## 2022-01-17 LAB — LIPASE, BLOOD: Lipase: 71 U/L — ABNORMAL HIGH (ref 11–51)

## 2022-01-17 MED ORDER — ONDANSETRON HCL 4 MG PO TABS: 4.0000 mg | ORAL_TABLET | Freq: Four times a day (QID) | ORAL | Status: DC | PRN

## 2022-01-17 MED ORDER — SODIUM CHLORIDE 0.9 % IV SOLN: INTRAVENOUS | Status: DC

## 2022-01-17 MED ORDER — ACETAMINOPHEN 650 MG RE SUPP: 650.0000 mg | Freq: Four times a day (QID) | RECTAL | Status: DC | PRN

## 2022-01-17 MED ORDER — LACTATED RINGERS IV BOLUS
1000.0000 mL | Freq: Once | INTRAVENOUS | Status: AC
  Administered 2022-01-17: 1000 mL via INTRAVENOUS

## 2022-01-17 MED ORDER — ONDANSETRON HCL 4 MG/2ML IJ SOLN: 4.0000 mg | Freq: Four times a day (QID) | INTRAMUSCULAR | Status: DC | PRN

## 2022-01-17 MED ORDER — ACETAMINOPHEN 325 MG PO TABS
650.0000 mg | ORAL_TABLET | Freq: Four times a day (QID) | ORAL | Status: DC | PRN
  Administered 2022-01-19: 650 mg via ORAL
  Filled 2022-01-17: qty 2

## 2022-01-17 MED ORDER — IOHEXOL 300 MG/ML  SOLN
100.0000 mL | Freq: Once | INTRAMUSCULAR | Status: AC | PRN
  Administered 2022-01-17: 100 mL via INTRAVENOUS

## 2022-01-17 MED ORDER — ENOXAPARIN SODIUM 40 MG/0.4ML IJ SOSY
40.0000 mg | PREFILLED_SYRINGE | INTRAMUSCULAR | Status: DC
  Administered 2022-01-17 – 2022-01-20 (×4): 40 mg via SUBCUTANEOUS
  Filled 2022-01-17 (×4): qty 0.4

## 2022-01-17 MED ORDER — SODIUM CHLORIDE (PF) 0.9 % IJ SOLN
INTRAMUSCULAR | Status: AC
  Filled 2022-01-17: qty 50

## 2022-01-17 MED ORDER — ONDANSETRON HCL 4 MG/2ML IJ SOLN
4.0000 mg | Freq: Once | INTRAMUSCULAR | Status: AC
  Administered 2022-01-17: 4 mg via INTRAVENOUS
  Filled 2022-01-17: qty 2

## 2022-01-17 MED ORDER — METHOCARBAMOL 1000 MG/10ML IJ SOLN: 500.0000 mg | Freq: Four times a day (QID) | INTRAVENOUS | Status: DC | PRN

## 2022-01-17 NOTE — ED Triage Notes (Signed)
Pt arrives from home via [facility]. Per report, the pt was reporiting diarrhea and excessive thirst for about a week. En route, vitals were Hr 113-145, 122/84, 96.1 temporal. 143 cbg, 98% ra.

## 2022-01-17 NOTE — Progress Notes (Signed)
   01/17/22 1443  Assess: MEWS Score  Temp 97.6 F (36.4 C)  BP 130/83  MAP (mmHg) 98  Pulse Rate (!) 105  Resp (!) 24  SpO2 97 %  O2 Device Room Air  Assess: MEWS Score  MEWS Temp 0  MEWS Systolic 0  MEWS Pulse 1  MEWS RR 1  MEWS LOC 0  MEWS Score 2  MEWS Score Color Yellow  Assess: if the MEWS score is Yellow or Red  Were vital signs taken at a resting state? Yes  Focused Assessment No change from prior assessment  Does the patient meet 2 or more of the SIRS criteria? No  MEWS guidelines implemented *See Row Information* Yes  Treat  MEWS Interventions Other (Comment) (MD notified)  Take Vital Signs  Increase Vital Sign Frequency  Yellow: Q 2hr X 2 then Q 4hr X 2, if remains yellow, continue Q 4hrs  Escalate  MEWS: Escalate Yellow: discuss with charge nurse/RN and consider discussing with provider and RRT  Notify: Charge Nurse/RN  Name of Charge Nurse/RN Notified Dahlia Client RN  Date Charge Nurse/RN Notified 01/17/22  Time Charge Nurse/RN Notified 1458  Provider Notification  Provider Name/Title Ramesh MD  Date Provider Notified 01/17/22  Time Provider Notified 1440  Method of Notification Page  Notification Reason Other (Comment)  Provider response No new orders  Date of Provider Response 01/17/22  Time of Provider Response 1455  Document  Patient Outcome Other (Comment) (will continue to monitor)  Progress note created (see row info) Yes  Assess: SIRS CRITERIA  SIRS Temperature  0  SIRS Pulse 1  SIRS Respirations  1  SIRS WBC 1  SIRS Score Sum  3    Pt. Just admitted in the unit. Initial vitals shows yellow MEWS. MD notified, all orders released and carried out. Pt. Denies N/V, palpitation, SOB and chest pain. Ill appearing but not in distress. Will closely monitor patient.

## 2022-01-17 NOTE — Hospital Course (Addendum)
41 year old male with history of asthma and recent multiple hospitalizations: Initially for paronychia needing I&D on 11/8 and discharged home on Bactrim, readmitted 01/11/22 with nausea vomiting poor intake dehydration mouth sores-no other skin lesion low concern for SJS, AKI dehydration hyponatremia and discharged 11/16, presents again with 3 to 4 days of nausea vomiting, diarrhea, poor intake.  In ED: Initially tachycardic 114 BP 100s, labs with hyponatremia 130>126, 11 low 1.8 LFT ALT 68 leukocytosis 12.2/thrombocytosis.  Underwent CT abdomen w/ "Diffusely inflamed, nondilated small bowel loops with mucosal hyperenhancement. Relative sparing of the terminal ileum. Favor Acute Infectious Enteritis. Fluid distended stomach, and some fluid in the proximal colon compatible with diarrhea" patient appeared dry dehydrated given 3 L RL, C. difficile GI panel ordered and admission requested for further management C. difficile came back negative. On admit-Had urine retention needing in and out cath

## 2022-01-17 NOTE — ED Triage Notes (Signed)
Pt reports for about 3-4 days of diarrhea. He says he is feeling weak. Midline upper abdominal pain. Denies fevers. Reporting decreased oral intake.

## 2022-01-17 NOTE — ED Notes (Signed)
Informed Dr. Dayna Barker of bladder scan of 844. He ordered I&O cath

## 2022-01-17 NOTE — ED Provider Notes (Signed)
[facility] DEPT Provider Note   CSN: [id number] Arrival date & time: 01/17/22  [id number]     History  No chief complaint on file.   Billy Pace is a 41 y.o. male.  Presents to the emergency department for evaluation of nausea, vomiting, diarrhea.  Patient was recently treated for infection of his finger with antibiotics, had onset of nausea and vomiting after starting the Bactrim.  He was hospitalized on November 15 for dehydration secondary to nausea and vomiting.  He reports that he continues to have nausea and vomiting but now for the last 3 to 4 days has had diarrhea as well.       Home Medications Prior to Admission medications   Medication Sig Start Date End Date Taking? Authorizing Provider  acetaminophen (TYLENOL) 500 MG tablet Take 1,000 mg by mouth every 6 (six) hours as needed for moderate pain.    [provider]  albuterol (VENTOLIN HFA) 108 (90 Base) MCG/ACT inhaler Inhale 1-2 puffs into the lungs every 6 (six) hours as needed for wheezing or shortness of breath. Patient not taking: Reported on 01/11/2022 09/13/18   Jacqlyn Larsen, PA-C  Calcium Carbonate Antacid (TUMS PO) Take 2-3 tablets by mouth daily as needed (heartburn).    [provider]  ibuprofen (ADVIL) 600 MG tablet Take 1 tablet (600 mg total) by mouth every 6 (six) hours as needed. Patient taking differently: Take 600 mg by mouth 2 (two) times daily as needed for moderate pain. 01/04/22   Domenic Moras, PA-C  Multiple Vitamins-Minerals (MULTIVITAMIN WITH MINERALS) tablet Take 1 tablet by mouth daily.    [provider]  loratadine (CLARITIN) 10 MG tablet Take 1 tablet (10 mg total) by mouth daily. Patient not taking: Reported on 07/30/2017 05/30/16 03/18/20  Clent Demark, PA-C      Allergies    Bactrim [sulfamethoxazole-trimethoprim]    Review of Systems   Review of Systems  Physical Exam Updated Vital Signs BP 118/85 (BP Location: Left Arm)    Pulse (!) 106   Temp 99.5 F (37.5 C) (Rectal)   Resp 18   SpO2 94%  Physical Exam Vitals and nursing note reviewed.  Constitutional:      General: He is not in acute distress.    Appearance: He is well-developed.  HENT:     Head: Normocephalic and atraumatic.     Mouth/Throat:     Mouth: Mucous membranes are dry. Oral lesions (cracking, crusting lips) present.  Eyes:     General: Vision grossly intact. Gaze aligned appropriately.     Extraocular Movements: Extraocular movements intact.     Conjunctiva/sclera: Conjunctivae normal.  Cardiovascular:     Rate and Rhythm: Regular rhythm. Tachycardia present.     Pulses: Normal pulses.     Heart sounds: Normal heart sounds, S1 normal and S2 normal. No murmur heard.    No friction rub. No gallop.  Pulmonary:     Effort: Pulmonary effort is normal. No respiratory distress.     Breath sounds: Normal breath sounds.  Abdominal:     Palpations: Abdomen is soft.     Tenderness: There is no abdominal tenderness. There is no guarding or rebound.     Hernia: No hernia is present.  Musculoskeletal:        General: No swelling.     Cervical back: Full passive range of motion without pain, normal range of motion and neck supple. No pain with movement, spinous process tenderness or muscular  tenderness. Normal range of motion.     Right lower leg: No edema.     Left lower leg: No edema.  Skin:    General: Skin is warm and dry.     Capillary Refill: Capillary refill takes less than 2 seconds.     Findings: No ecchymosis, erythema, lesion or wound.  Neurological:     Mental Status: He is alert and oriented to person, place, and time.     GCS: GCS eye subscore is 4. GCS verbal subscore is 5. GCS motor subscore is 6.     Cranial Nerves: Cranial nerves 2-12 are intact.     Sensory: Sensation is intact.     Motor: Motor function is intact. No weakness or abnormal muscle tone.     Coordination: Coordination is intact.  Psychiatric:        Mood  and Affect: Mood normal.        Speech: Speech normal.        Behavior: Behavior normal.     ED Results / Procedures / Treatments   Labs (all labs ordered are listed, but only abnormal results are displayed) Labs Reviewed  C DIFFICILE QUICK SCREEN W PCR REFLEX    GASTROINTESTINAL PANEL BY PCR, STOOL (REPLACES STOOL CULTURE)  CBC WITH DIFFERENTIAL/PLATELET  COMPREHENSIVE METABOLIC PANEL  LACTIC ACID, PLASMA  LIPASE, BLOOD  URINALYSIS, ROUTINE W REFLEX MICROSCOPIC    EKG None  Radiology No results found.  Procedures Procedures    Medications Ordered in ED Medications  ondansetron (ZOFRAN) injection 4 mg (4 mg Intravenous Given 01/17/22 0412)  lactated ringers bolus 1,000 mL (1,000 mLs Intravenous New Bag/Given 01/17/22 0413)    Followed by  lactated ringers bolus 1,000 mL (1,000 mLs Intravenous New Bag/Given 01/17/22 0413)    ED Course/ Medical Decision Making/ A&P                           Medical Decision Making Amount and/or Complexity of Data Reviewed External Data Reviewed: labs and notes. Labs: ordered. Decision-making details documented in ED Course. Radiology: ordered and independent interpretation performed. Decision-making details documented in ED Course.  Risk Prescription drug management. Decision regarding hospitalization.   Presents to the emergency department for evaluation of nausea, vomiting and diarrhea.  His most recent medical history is somewhat complicated.  Patient was treated for paronychia with Bactrim, developed nausea and vomiting requiring hospitalization 1 week ago.  During his hospitalization there was concern for possible Stevens-Johnson secondary to the Bactrim, as he had oral lesions noted.  Patient with dry, crusting, scabbing cracking lips currently.  Oropharyngeal examination is extremely dry, patient is tachycardic and clinically very dehydrated.  Patient has been given 2 L of lactated Ringer's, still tachycardic and cannot pass  any urine.  Will likely require continuous IV hydration resuscitation in the hospital.  CT scan shows diffuse edema of the small bowel.  Still awaiting stool studies to evaluate for infectious diarrhea and C. difficile.  Gust with hospitalist, will admit patient for further management.        Final Clinical Impression(s) / ED Diagnoses Final diagnoses:  Nausea vomiting and diarrhea  Dehydration    Rx / DC Orders ED Discharge Orders     None         Gilda Crease, MD 01/17/22 [phone]

## 2022-01-17 NOTE — H&P (Signed)
History and Physical    Billy Pace DDU:[id number] DOB: Aug 19, 1980 DOA: 01/17/2022  PCP: Jackie Plum, MD   Patient coming from: HOME Chief Complaint  Patient presents with   Diarrhea    HPI:41 year old male with history of asthma and recent multiple hospitalizations: Initially for paronychia needing I&D on 11/8 and discharged home on Bactrim, readmitted 01/11/22 with nausea vomiting poor intake dehydration mouth sores-no other skin lesion low concern for SJS, AKI dehydration hyponatremia and discharged 11/16, presents again with 3 to 4 days of nausea vomiting, diarrhea, poor intake.  In ED: Initially tachycardic 114 BP 100s, labs with hyponatremia 130>126, 11 low 1.8 LFT ALT 68 leukocytosis 12.2/thrombocytosis.  Underwent CT abdomen w/ "Diffusely inflamed, nondilated small bowel loops with mucosal hyperenhancement. Relative sparing of the terminal ileum. Favor Acute Infectious Enteritis. Fluid distended stomach, and some fluid in the proximal colon compatible with diarrhea" patient appeared dry dehydrated given 3 L RL, C. difficile GI panel ordered and admission requested for further management Patient reports no more diarrhea since coming to the hospital.  Complains of feeling dry, has/lesion on his lips mild which are scabbed and has not changed. Has diffuse abdominal pain otherwise no fever, chills, focal weakness headache.  No other skin lesions. This morning having urine retention getting in and out cath.  Assessment/Plan Principal Problem:   Acute gastroenteritis Active Problems:   Hyponatremia   Leucocytosis   Transaminitis   Diarrhea  Acute gastroenteritis with nausea vomiting diarrhea: Recent multiple hospitalization has had 6 days of antibiotics for paronychia that had resolved.  Checking C. difficile, GI panel, managing empirically with liquid diet, antiemetics pain medication and aggressive IV fluid hydration.Lactic acid,BP stable.  Hypovolemic  hyponatremia Dehydration: Starting diet, continue aggressive IV hydration.  Monitor labs  Leukocytosis: Unclear etiology likely in the setting of #1/dehydration.  Afebrile currently, repeat CBC in the morning blood culture if febrile.  Mildly elevated ALT.  Monitor Asthma on as needed albuterol. Lip/mouth lesions -scabbed Class I obesity with BMI 30.8: Will benefit with weight loss healthy lifestyle  Severity of Illness: The appropriate patient status for this patient is OBSERVATION. Observation status is judged to be reasonable and necessary in order to provide the required intensity of service to ensure the patient's safety. The patient's presenting symptoms, physical exam findings, and initial radiographic and laboratory data in the context of their medical condition is felt to place them at decreased risk for further clinical deterioration. Furthermore, it is anticipated that the patient will be medically stable for discharge from the hospital within 2 midnights of admission.    DVT prophylaxis: enoxaparin (LOVENOX) injection 40 mg Start: 01/17/22 1000 Code Status:   Code Status: Full Code  Family Communication: Admission, patients condition and plan of care including tests being ordered have been discussed with the patient who indicate understanding and agree with the plan and Code Status.  Consults called:  NONE  Review of Systems: All systems were reviewed and were negative except as mentioned in HPI above. Negative for fever Negative for chest pain Negative for shortness of breath  Past Medical History:  Diagnosis Date   Rectal abscess     History reviewed. No pertinent surgical history.   reports that he has never smoked. He has never used smokeless tobacco. He reports that he does not drink alcohol and does not use drugs.  Allergies  Allergen Reactions   Bactrim [Sulfamethoxazole-Trimethoprim] Nausea And Vomiting and Rash    Patient had N/V a few hours after taking  Bactrim that worsened after continued use of the drug. He also developed a dry, tender rash localized to his mouth.     History reviewed. No pertinent family history.   Prior to Admission medications   Medication Sig Start Date End Date Taking? Authorizing Provider  acetaminophen (TYLENOL) 500 MG tablet Take 1,000 mg by mouth every 6 (six) hours as needed for moderate pain.    [provider]  albuterol (VENTOLIN HFA) 108 (90 Base) MCG/ACT inhaler Inhale 1-2 puffs into the lungs every 6 (six) hours as needed for wheezing or shortness of breath. Patient not taking: Reported on 01/11/2022 09/13/18   Jacqlyn Larsen, PA-C  Calcium Carbonate Antacid (TUMS PO) Take 2-3 tablets by mouth daily as needed (heartburn).    [provider]  ibuprofen (ADVIL) 600 MG tablet Take 1 tablet (600 mg total) by mouth every 6 (six) hours as needed. Patient taking differently: Take 600 mg by mouth 2 (two) times daily as needed for moderate pain. 01/04/22   Domenic Moras, PA-C  Multiple Vitamins-Minerals (MULTIVITAMIN WITH MINERALS) tablet Take 1 tablet by mouth daily.    [provider]  loratadine (CLARITIN) 10 MG tablet Take 1 tablet (10 mg total) by mouth daily. Patient not taking: Reported on 07/30/2017 05/30/16 03/18/20  Clent Demark, PA-C    Physical Exam: Vitals:   01/17/22 0700 01/17/22 0715 01/17/22 0755 01/17/22 0931  BP: (!) 141/90 138/83    Pulse: 100 (!) 101    Resp: (!) 23 (!) 23    Temp:   97.7 F (36.5 C)   TempSrc:   Axillary   SpO2: 96% 98%    Weight:    108 kg  Height:    6\' 2"  (1.88 m)    General exam: AAOx3 , NAD, weak appearing. HEENT:Oral mucosa dry with scabbed lips lesion,ear/Nose WNL grossly, dentition normal. Respiratory system: bilaterally clear,no wheezing or crackles,no use of accessory muscle Cardiovascular system: S1 & S2 +, No JVD,. Gastrointestinal system: Abdomen soft, mild diffuse tenderness ,ND, BS+ Nervous System:Alert, awake, moving  extremities and grossly nonfocal Extremities: No edema, distal peripheral pulses palpable.  Skin: No rashes,no icterus. MSK: Normal muscle bulk,tone, power   Labs on Admission: I have personally reviewed following labs and imaging studies  CBC: Recent Labs  Lab 01/10/22 1756 01/12/22 0356 01/17/22 0404  WBC 6.1 7.3 12.2*  NEUTROABS 3.8  --  7.2  HGB 17.6* 15.3 18.3*  HCT 50.1 44.0 53.4*  MCV 87.3 87.6 87.4  PLT 285 267 [id number]*   Basic Metabolic Panel: Recent Labs  Lab 01/10/22 1756 01/11/22 1204 01/12/22 0356 01/17/22 0404  NA 130* 136 130* 126*  K 4.4 4.8 4.0 4.2  CL 101 105 101 99  CO2 18* 21* 23 17*  GLUCOSE 217* 168* 131* 153*  BUN 36* 29* 24* 17  CREATININE 1.39* 1.09 0.86 0.94  CALCIUM 7.1* 7.6* 7.2* 7.3*   GFR: Estimated Creatinine Clearance: 138 mL/min (by C-G formula based on SCr of 0.94 mg/dL). Liver Function Tests: Recent Labs  Lab 01/10/22 1756 01/12/22 0356 01/17/22 0404  AST 52* 41 36  ALT 53* 45* 68*  ALKPHOS 31* 30* 53  BILITOT 0.6 0.7 0.9  PROT 5.5* 4.8* 4.7*  ALBUMIN 2.0* 1.8* 1.8*   Recent Labs  Lab 01/17/22 0404  LIPASE 71*  Urine analysis:    Component Value Date/Time   COLORURINE YELLOW 01/17/2022 0842   APPEARANCEUR CLEAR 01/17/2022 0842   LABSPEC 1.034 (H) 01/17/2022 0842   PHURINE 5.0  01/17/2022 0842   GLUCOSEU NEGATIVE 01/17/2022 0842   HGBUR SMALL (A) 01/17/2022 0842   BILIRUBINUR NEGATIVE 01/17/2022 0842   KETONESUR 20 (A) 01/17/2022 0842   PROTEINUR NEGATIVE 01/17/2022 0842   NITRITE NEGATIVE 01/17/2022 0842   LEUKOCYTESUR NEGATIVE 01/17/2022 0842    Radiological Exams on Admission: CT ABDOMEN PELVIS W CONTRAST  Result Date: 01/17/2022 CLINICAL DATA:  41 year old male with abdominal pain and diarrhea for 3-4 days. EXAM: CT ABDOMEN AND PELVIS WITH CONTRAST TECHNIQUE: Multidetector CT imaging of the abdomen and pelvis was performed using the standard protocol following bolus administration of intravenous contrast.  RADIATION DOSE REDUCTION: This exam was performed according to the departmental dose-optimization program which includes automated exposure control, adjustment of the mA and/or kV according to patient size and/or use of iterative reconstruction technique. CONTRAST:  181mL OMNIPAQUE IOHEXOL 300 MG/ML  SOLN COMPARISON:  None Available. FINDINGS: Lower chest: Negative. Hepatobiliary: Negative liver and gallbladder. Pancreas: Negative. Spleen: Negative. Adrenals/Urinary Tract: Negative, aside from moderately distended urinary bladder. With mucosal Stomach/Bowel: Negative large bowel from the splenic flexure distally. Fluid containing, nondilated right colon and transverse colon. Evidence of prior appendectomy at the tip of the cecum series 2, image 69. No convincing large bowel inflammation. However, small bowel loops appear diffusely inflamed with mucosal hyperenhancement and wall thickening (series 2, image 61 and coronal image 69). Jejunum appears somewhat more affected than the ileum. And the terminal ileum appears relatively spared. Stomach is fluid distended. Duodenum is decompressed and also appears mildly inflamed. No free air or free fluid. No convincing mesenteric inflammation. Vascular/Lymphatic: Major arterial structures and portal venous system appear patent and within normal limits. No lymphadenopathy identified. Reproductive: Negative. Other: No pelvic free fluid. Musculoskeletal: Negative. IMPRESSION: Diffusely inflamed, nondilated small bowel loops with mucosal hyperenhancement. Relative sparing of the terminal ileum. Favor Acute Infectious Enteritis. Fluid distended stomach, and some fluid in the proximal colon compatible with diarrhea. No bowel obstruction or other complicating features. Electronically Signed   By: Genevie Ann M.D.   On: 01/17/2022 06:44    Antonieta Pert MD [facility]  If 7PM-7AM, please contact night-coverage [email or web address]  01/17/2022, 9:36 AM

## 2022-01-17 NOTE — Progress Notes (Signed)
Patient called.  Unable to reach patient.

## 2022-01-18 DIAGNOSIS — K529 Noninfective gastroenteritis and colitis, unspecified: Secondary | ICD-10-CM | POA: Diagnosis present

## 2022-01-18 DIAGNOSIS — Z79899 Other long term (current) drug therapy: Secondary | ICD-10-CM | POA: Diagnosis not present

## 2022-01-18 DIAGNOSIS — E860 Dehydration: Secondary | ICD-10-CM | POA: Diagnosis present

## 2022-01-18 DIAGNOSIS — R339 Retention of urine, unspecified: Secondary | ICD-10-CM | POA: Diagnosis present

## 2022-01-18 DIAGNOSIS — R197 Diarrhea, unspecified: Secondary | ICD-10-CM | POA: Diagnosis present

## 2022-01-18 DIAGNOSIS — E871 Hypo-osmolality and hyponatremia: Secondary | ICD-10-CM | POA: Diagnosis present

## 2022-01-18 DIAGNOSIS — Z6830 Body mass index (BMI) 30.0-30.9, adult: Secondary | ICD-10-CM | POA: Diagnosis not present

## 2022-01-18 DIAGNOSIS — J45909 Unspecified asthma, uncomplicated: Secondary | ICD-10-CM | POA: Diagnosis present

## 2022-01-18 DIAGNOSIS — E669 Obesity, unspecified: Secondary | ICD-10-CM | POA: Diagnosis present

## 2022-01-18 DIAGNOSIS — R7401 Elevation of levels of liver transaminase levels: Secondary | ICD-10-CM | POA: Diagnosis present

## 2022-01-18 DIAGNOSIS — Z882 Allergy status to sulfonamides status: Secondary | ICD-10-CM | POA: Diagnosis not present

## 2022-01-18 DIAGNOSIS — A09 Infectious gastroenteritis and colitis, unspecified: Secondary | ICD-10-CM | POA: Diagnosis present

## 2022-01-18 LAB — GASTROINTESTINAL PANEL BY PCR, STOOL (REPLACES STOOL CULTURE)

## 2022-01-18 LAB — CBC
HCT: 43.9 % (ref 39.0–52.0)
Hemoglobin: 15 g/dL (ref 13.0–17.0)
MCH: 30 pg (ref 26.0–34.0)
MCHC: 34.2 g/dL (ref 30.0–36.0)
MCV: 87.8 fL (ref 80.0–100.0)
Platelets: 563 10*3/uL — ABNORMAL HIGH (ref 150–400)
RBC: 5 MIL/uL (ref 4.22–5.81)
RDW: 12.7 % (ref 11.5–15.5)
WBC: 9.1 10*3/uL (ref 4.0–10.5)
nRBC: 0 % (ref 0.0–0.2)

## 2022-01-18 LAB — COMPREHENSIVE METABOLIC PANEL
ALT: 30 U/L (ref 0–44)
AST: 22 U/L (ref 15–41)
Albumin: <1.5 g/dL — ABNORMAL LOW (ref 3.5–5.0)
Alkaline Phosphatase: 33 U/L — ABNORMAL LOW (ref 38–126)
Anion gap: 5 (ref 5–15)
BUN: 15 mg/dL (ref 6–20)
CO2: 21 mmol/L — ABNORMAL LOW (ref 22–32)
Calcium: 6.9 mg/dL — ABNORMAL LOW (ref 8.9–10.3)
Chloride: 103 mmol/L (ref 98–111)
Creatinine, Ser: 0.72 mg/dL (ref 0.61–1.24)
GFR, Estimated: >60 mL/min (ref >60)
Glucose, Bld: 108 mg/dL — ABNORMAL HIGH (ref 70–99)
Potassium: 4.1 mmol/L (ref 3.5–5.1)
Sodium: 129 mmol/L — ABNORMAL LOW (ref 135–145)
Total Bilirubin: 0.7 mg/dL (ref 0.3–1.2)
Total Protein: 3.9 g/dL — ABNORMAL LOW (ref 6.5–8.1)

## 2022-01-18 MED ORDER — LIP MEDEX EX OINT
TOPICAL_OINTMENT | CUTANEOUS | Status: DC | PRN
  Filled 2022-01-18: qty 7

## 2022-01-18 NOTE — Progress Notes (Signed)
  Transition of Care [facility]) Screening Note   Patient Details  Name: Billy Pace Date of Birth: August 29, 1980   Transition of Care [facility] CM/SW Contact:    Otelia Santee, LCSW Phone Number: 01/18/2022, 10:44 AM    Transition of Care Department [facility]) has reviewed patient and no TOC needs have been identified at this time. We will continue to monitor patient advancement through interdisciplinary progression rounds. If new patient transition needs arise, please place a TOC consult.

## 2022-01-18 NOTE — Progress Notes (Signed)
PROGRESS NOTE Billy RushDon P Lasseter  ZOX:[id number] DOB: 30-Jun-1980 DOA: 01/17/2022 PCP: Jackie Plumsei-Bonsu, George, MD   Brief Narrative/Hospital Course: 41 year old male with history of asthma and recent multiple hospitalizations: Initially for paronychia needing I&D on 11/8 and discharged home on Bactrim, readmitted 01/11/22 with nausea vomiting poor intake dehydration mouth sores-no other skin lesion low concern for SJS, AKI dehydration hyponatremia and discharged 11/16, presents again with 3 to 4 days of nausea vomiting, diarrhea, poor intake.  In ED: Initially tachycardic 114 BP 100s, labs with hyponatremia 130>126, 11 low 1.8 LFT ALT 68 leukocytosis 12.2/thrombocytosis.  Underwent CT abdomen w/ "Diffusely inflamed, nondilated small bowel loops with mucosal hyperenhancement. Relative sparing of the terminal ileum. Favor Acute Infectious Enteritis. Fluid distended stomach, and some fluid in the proximal colon compatible with diarrhea" patient appeared dry dehydrated given 3 L RL, C. difficile GI panel ordered and admission requested for further management C. difficile came back negative. On admit-Had urine retention needing in and out cath     Subjective: Seen and examined this morning no more nausea vomiting, remains on full liquid diet afraid to advance diet today.  Had mushy BM this morning no abdominal pain   Assessment and Plan: Principal Problem:   Acute gastroenteritis Active Problems:   Hyponatremia   Leucocytosis   Transaminitis   Diarrhea   Gastroenteritis   Acute gastroenteritis with nausea vomiting diarrhea: Recent multiple hospitalization has had 6 days of antibiotics for paronychia that had resolved.  Negative for significant GIP, discontinue enteric precaution.  Nausea vomiting diarrhea seems to be resolving.  Continue IV hydration, continue antiemetics PPI, on clear liquid diet advance as tolerated   Hypovolemic hyponatremia Dehydration: Sodium slowly improving continue gentle IV  with hydration, continue liquid diet and advance as tolerated as above repeat BMP in the morning  Recent Labs  Lab 01/11/22 1204 01/12/22 0356 01/17/22 0404 01/18/22 0534  NA 136 130* 126* 129*      Leukocytosis: Unclear etiology likely in the setting of #1/dehydration.  It has resolved. Mildly elevated ALT.  Monitor Asthma on as needed albuterol.  Not in exacerbation Lip/mouth lesions -scabbed and healing. Class I obesity with BMI 30.8: Will benefit with weight loss healthy lifestyle  DVT prophylaxis: enoxaparin (LOVENOX) injection 40 mg Start: 01/17/22 1000 Code Status:   Code Status: Full Code Family Communication: plan of care discussed with patient at bedside. Patient status is: Inpatient because of acute gastroenteritis and hyponatremia needing IV fluids Level of care: Telemetry   Dispo: The patient is from: Home            Anticipated disposition: Home in next 24 hours Objective: Vitals last 24 hrs: Vitals:   01/17/22 1443 01/17/22 1656 01/17/22 2217 01/18/22 0159  BP: 130/83 129/80 128/78 126/69  Pulse: (!) 105 (!) 111 (!) 101 97  Resp: (!) 24 18 18 20   Temp: 97.6 F (36.4 C) (!) 97.3 F (36.3 C) 98.4 F (36.9 C) 98 F (36.7 C)  TempSrc: Oral Oral Oral   SpO2: 97% 96% 97% 96%  Weight:      Height:       Weight change:   Physical Examination: General exam: alert awake, older than stated age HEENT:Oral mucosa moist, lip lesion is scabbed, ear/Nose WNL grossly Respiratory system: bilaterally clear  BS, no use of accessory muscle Cardiovascular system: S1 & S2 +, No JVD. Gastrointestinal system: Abdomen soft,NT,ND, BS+ Nervous System:Alert, awake, moving extremities. Extremities: LE edema neg ,distal peripheral pulses palpable.  Skin: No rashes,no icterus. MSK:  Normal muscle bulk,tone, power  Medications reviewed:  Scheduled Meds:  enoxaparin (LOVENOX) injection  40 mg Subcutaneous Q24H   Continuous Infusions:  sodium chloride 150 mL/hr at 01/17/22 2141    methocarbamol (ROBAXIN) IV      Diet Order             Diet clear liquid Room service appropriate? Yes; Fluid consistency: Thin  Diet effective now                   Intake/Output Summary (Last 24 hours) at 01/18/2022 1021 Last data filed at 01/18/2022 0956 Gross per 24 hour  Intake 3479.37 ml  Output 900 ml  Net 2579.37 ml   Net IO Since Admission: 5,579.37 mL [01/18/22 1021]  Wt Readings from Last 3 Encounters:  01/17/22 108 kg  01/12/22 108.9 kg  01/04/22 108.9 kg     Unresulted Labs (From admission, onward)     Start     Ordered   01/24/22 0500  Creatinine, serum  (enoxaparin (LOVENOX)    CrCl >/= 30 ml/min)  Weekly,   R     Comments: while on enoxaparin therapy    01/17/22 0841   01/19/22 [id number]  Basic metabolic panel  Daily at 5am,   R      01/18/22 1021          Data Reviewed: I have personally reviewed following labs and imaging studies CBC: Recent Labs  Lab 01/12/22 0356 01/17/22 0404 01/18/22 0534  WBC 7.3 12.2* 9.1  NEUTROABS  --  7.2  --   HGB 15.3 18.3* 15.0  HCT 44.0 53.4* 43.9  MCV 87.6 87.4 87.8  PLT 267 608* [id number]*   Basic Metabolic Panel: Recent Labs  Lab 01/11/22 1204 01/12/22 0356 01/17/22 0404 01/18/22 0534  NA 136 130* 126* 129*  K 4.8 4.0 4.2 4.1  CL 105 101 99 103  CO2 21* 23 17* 21*  GLUCOSE 168* 131* 153* 108*  BUN 29* 24* 17 15  CREATININE 1.09 0.86 0.94 0.72  CALCIUM 7.6* 7.2* 7.3* 6.9*   GFR: Estimated Creatinine Clearance: 162.2 mL/min (by C-G formula based on SCr of 0.72 mg/dL). Liver Function Tests: Recent Labs  Lab 01/12/22 0356 01/17/22 0404 01/18/22 0534  AST 41 36 22  ALT 45* 68* 30  ALKPHOS 30* 53 33*  BILITOT 0.7 0.9 0.7  PROT 4.8* 4.7* 3.9*  ALBUMIN 1.8* 1.8* <1.5*   Recent Labs  Lab 01/17/22 0404  LIPASE 71*   No results for input(s): "AMMONIA" in the last 168 hours. Coagulation Profile: No results for input(s): "INR", "PROTIME" in the last 168 hours. BNP (last 3 results) No results  for input(s): "PROBNP" in the last 8760 hours. HbA1C: No results for input(s): "HGBA1C" in the last 72 hours. CBG: No results for input(s): "GLUCAP" in the last 168 hours. Lipid Profile: No results for input(s): "CHOL", "HDL", "LDLCALC", "TRIG", "CHOLHDL", "LDLDIRECT" in the last 72 hours. Thyroid Function Tests: No results for input(s): "TSH", "T4TOTAL", "FREET4", "T3FREE", "THYROIDAB" in the last 72 hours. Sepsis Labs: Recent Labs  Lab 01/17/22 0404  LATICACIDVEN 1.3    Recent Results (from the past 240 hour(s))  Resp Panel by RT-PCR (Flu A&B, Covid) Anterior Nasal Swab     Status: None   Collection Time: 01/10/22  5:57 PM   Specimen: Anterior Nasal Swab  Result Value Ref Range Status   SARS Coronavirus 2 by RT PCR NEGATIVE NEGATIVE Final    Comment: (NOTE) SARS-CoV-2 target nucleic acids are  NOT DETECTED.  The SARS-CoV-2 RNA is generally detectable in upper respiratory specimens during the acute phase of infection. The lowest concentration of SARS-CoV-2 viral copies this assay can detect is 138 copies/mL. A negative result does not preclude SARS-Cov-2 infection and should not be used as the sole basis for treatment or other patient management decisions. A negative result may occur with  improper specimen collection/handling, submission of specimen other than nasopharyngeal swab, presence of viral mutation(s) within the areas targeted by this assay, and inadequate number of viral copies(<138 copies/mL). A negative result must be combined with clinical observations, patient history, and epidemiological information. The expected result is Negative.  Fact Sheet for Patients:  EntrepreneurPulse.com.au  Fact Sheet for Healthcare Providers:  IncredibleEmployment.be  This test is no t yet approved or cleared by the [place] FDA and  has been authorized for detection and/or diagnosis of SARS-CoV-2 by FDA under an Emergency Use  Authorization (EUA). This EUA will remain  in effect (meaning this test can be used) for the duration of the COVID-19 declaration under Section 564(b)(1) of the Act, 21 U.S.C.section 360bbb-3(b)(1), unless the authorization is terminated  or revoked sooner.       Influenza A by PCR NEGATIVE NEGATIVE Final   Influenza B by PCR NEGATIVE NEGATIVE Final    Comment: (NOTE) The Xpert Xpress SARS-CoV-2/FLU/RSV plus assay is intended as an aid in the diagnosis of influenza from Nasopharyngeal swab specimens and should not be used as a sole basis for treatment. Nasal washings and aspirates are unacceptable for Xpert Xpress SARS-CoV-2/FLU/RSV testing.  Fact Sheet for Patients: EntrepreneurPulse.com.au  Fact Sheet for Healthcare Providers: IncredibleEmployment.be  This test is not yet approved or cleared by the [place] FDA and has been authorized for detection and/or diagnosis of SARS-CoV-2 by FDA under an Emergency Use Authorization (EUA). This EUA will remain in effect (meaning this test can be used) for the duration of the COVID-19 declaration under Section 564(b)(1) of the Act, 21 U.S.C. section 360bbb-3(b)(1), unless the authorization is terminated or revoked.  Performed at [facility], [place] [address], [place], [place] [address]   Blood culture (routine x 2)     Status: Abnormal   Collection Time: 01/10/22  5:57 PM   Specimen: BLOOD  Result Value Ref Range Status   Specimen Description BLOOD RIGHT ANTECUBITAL  Final   Special Requests   Final    BOTTLES DRAWN AEROBIC AND ANAEROBIC Blood Culture results may not be optimal due to an inadequate volume of blood received in culture bottles   Culture  Setup Time   Final    GRAM POSITIVE COCCI IN CLUSTERS BOTTLES DRAWN AEROBIC ONLY CRITICAL RESULT CALLED TO, READ BACK BY AND VERIFIED WITH: RN K. MOON 01/13/22 @ 0417 BY AB    Culture (A)  Final    STAPHYLOCOCCUS HOMINIS THE  SIGNIFICANCE OF ISOLATING THIS ORGANISM FROM A SINGLE SET OF BLOOD CULTURES WHEN MULTIPLE SETS ARE DRAWN IS UNCERTAIN. PLEASE NOTIFY THE MICROBIOLOGY DEPARTMENT WITHIN ONE WEEK IF SPECIATION AND SENSITIVITIES ARE REQUIRED. Performed at [facility], [place] [address] [place], [place] [address]    Report Status 01/14/2022 FINAL  Final  Blood Culture ID Panel (Reflexed)     Status: Abnormal   Collection Time: 01/10/22  5:57 PM  Result Value Ref Range Status   Enterococcus faecalis NOT DETECTED NOT DETECTED Final   Enterococcus Faecium NOT DETECTED NOT DETECTED Final   Listeria monocytogenes NOT DETECTED NOT DETECTED Final   Staphylococcus  species DETECTED (A) NOT DETECTED Final    Comment: CRITICAL RESULT CALLED TO, READ BACK BY AND VERIFIED WITH: RN K. MOON 01/13/22 @ 0417 BY AB    Staphylococcus aureus (BCID) NOT DETECTED NOT DETECTED Final   Staphylococcus epidermidis NOT DETECTED NOT DETECTED Final   Staphylococcus lugdunensis NOT DETECTED NOT DETECTED Final   Streptococcus species NOT DETECTED NOT DETECTED Final   Streptococcus agalactiae NOT DETECTED NOT DETECTED Final   Streptococcus pneumoniae NOT DETECTED NOT DETECTED Final   Streptococcus pyogenes NOT DETECTED NOT DETECTED Final   A.calcoaceticus-baumannii NOT DETECTED NOT DETECTED Final   Bacteroides fragilis NOT DETECTED NOT DETECTED Final   Enterobacterales NOT DETECTED NOT DETECTED Final   Enterobacter cloacae complex NOT DETECTED NOT DETECTED Final   Escherichia coli NOT DETECTED NOT DETECTED Final   Klebsiella aerogenes NOT DETECTED NOT DETECTED Final   Klebsiella oxytoca NOT DETECTED NOT DETECTED Final   Klebsiella pneumoniae NOT DETECTED NOT DETECTED Final   Proteus species NOT DETECTED NOT DETECTED Final   Salmonella species NOT DETECTED NOT DETECTED Final   Serratia marcescens NOT DETECTED NOT DETECTED Final   Haemophilus influenzae NOT DETECTED NOT DETECTED Final   Neisseria meningitidis NOT DETECTED NOT  DETECTED Final   Pseudomonas aeruginosa NOT DETECTED NOT DETECTED Final   Stenotrophomonas maltophilia NOT DETECTED NOT DETECTED Final   Candida albicans NOT DETECTED NOT DETECTED Final   Candida auris NOT DETECTED NOT DETECTED Final   Candida glabrata NOT DETECTED NOT DETECTED Final   Candida krusei NOT DETECTED NOT DETECTED Final   Candida parapsilosis NOT DETECTED NOT DETECTED Final   Candida tropicalis NOT DETECTED NOT DETECTED Final   Cryptococcus neoformans/gattii NOT DETECTED NOT DETECTED Final    Comment: Performed at [facility], [address] [place], [place] [address]  Blood culture (routine x 2)     Status: None   Collection Time: 01/11/22  6:40 AM   Specimen: BLOOD  Result Value Ref Range Status   Specimen Description BLOOD SITE NOT SPECIFIED  Final   Special Requests   Final    BOTTLES DRAWN AEROBIC AND ANAEROBIC Blood Culture adequate volume   Culture   Final    NO GROWTH 5 DAYS Performed at [facility], [address] [place], [place] [address]    Report Status 01/16/2022 FINAL  Final  Group A Strep by PCR     Status: None   Collection Time: 01/11/22  6:40 AM   Specimen: Throat; Sterile Swab  Result Value Ref Range Status   Group A Strep by PCR NOT DETECTED NOT DETECTED Final    Comment: Performed at [facility], [address] [place], [place] [address]  C Difficile Quick Screen w PCR reflex     Status: None   Collection Time: 01/17/22  5:56 PM   Specimen: STOOL  Result Value Ref Range Status   C Diff antigen NEGATIVE NEGATIVE Final   C Diff toxin NEGATIVE NEGATIVE Final   C Diff interpretation No C. difficile detected.  Final    Comment: Performed at [facility], [address], [place], [place] [address]  Gastrointestinal Panel by PCR , Stool     Status: None   Collection Time: 01/17/22  5:56 PM   Specimen: STOOL  Result Value Ref Range Status   Campylobacter species NOT DETECTED NOT DETECTED Final   Plesimonas  shigelloides NOT DETECTED NOT DETECTED Final   Salmonella species NOT DETECTED NOT DETECTED Final  Yersinia enterocolitica NOT DETECTED NOT DETECTED Final   Vibrio species NOT DETECTED NOT DETECTED Final   Vibrio cholerae NOT DETECTED NOT DETECTED Final   Enteroaggregative E coli (EAEC) NOT DETECTED NOT DETECTED Final   Enteropathogenic E coli (EPEC) NOT DETECTED NOT DETECTED Final   Enterotoxigenic E coli (ETEC) NOT DETECTED NOT DETECTED Final   Shiga like toxin producing E coli (STEC) NOT DETECTED NOT DETECTED Final   Shigella/Enteroinvasive E coli (EIEC) NOT DETECTED NOT DETECTED Final   Cryptosporidium NOT DETECTED NOT DETECTED Final   Cyclospora cayetanensis NOT DETECTED NOT DETECTED Final   Entamoeba histolytica NOT DETECTED NOT DETECTED Final   Giardia lamblia NOT DETECTED NOT DETECTED Final   Adenovirus F40/41 NOT DETECTED NOT DETECTED Final   Astrovirus NOT DETECTED NOT DETECTED Final   Norovirus GI/GII NOT DETECTED NOT DETECTED Final   Rotavirus A NOT DETECTED NOT DETECTED Final   Sapovirus (I, II, IV, and V) NOT DETECTED NOT DETECTED Final    Comment: Performed at [facility], [place] [place], [place] [address]    Antimicrobials: Anti-infectives (From admission, onward)    None      Culture/Microbiology    Component Value Date/Time   SDES BLOOD SITE NOT SPECIFIED 01/11/2022 0640   SPECREQUEST  01/11/2022 0640    BOTTLES DRAWN AEROBIC AND ANAEROBIC Blood Culture adequate volume   CULT  01/11/2022 0640    NO GROWTH 5 DAYS Performed at [facility], [place] [address] [place], [place] [address]    REPTSTATUS 01/16/2022 FINAL 01/11/2022 0640  Radiology Studies: CT ABDOMEN PELVIS W CONTRAST  Result Date: 01/17/2022 CLINICAL DATA:  41 year old male with abdominal pain and diarrhea for 3-4 days. EXAM: CT ABDOMEN AND PELVIS WITH CONTRAST TECHNIQUE: Multidetector CT imaging of the abdomen and pelvis was performed using the standard protocol  following bolus administration of intravenous contrast. RADIATION DOSE REDUCTION: This exam was performed according to the departmental dose-optimization program which includes automated exposure control, adjustment of the mA and/or kV according to patient size and/or use of iterative reconstruction technique. CONTRAST:  171mL OMNIPAQUE IOHEXOL 300 MG/ML  SOLN COMPARISON:  None Available. FINDINGS: Lower chest: Negative. Hepatobiliary: Negative liver and gallbladder. Pancreas: Negative. Spleen: Negative. Adrenals/Urinary Tract: Negative, aside from moderately distended urinary bladder. With mucosal Stomach/Bowel: Negative large bowel from the splenic flexure distally. Fluid containing, nondilated right colon and transverse colon. Evidence of prior appendectomy at the tip of the cecum series 2, image 69. No convincing large bowel inflammation. However, small bowel loops appear diffusely inflamed with mucosal hyperenhancement and wall thickening (series 2, image 61 and coronal image 69). Jejunum appears somewhat more affected than the ileum. And the terminal ileum appears relatively spared. Stomach is fluid distended. Duodenum is decompressed and also appears mildly inflamed. No free air or free fluid. No convincing mesenteric inflammation. Vascular/Lymphatic: Major arterial structures and portal venous system appear patent and within normal limits. No lymphadenopathy identified. Reproductive: Negative. Other: No pelvic free fluid. Musculoskeletal: Negative. IMPRESSION: Diffusely inflamed, nondilated small bowel loops with mucosal hyperenhancement. Relative sparing of the terminal ileum. Favor Acute Infectious Enteritis. Fluid distended stomach, and some fluid in the proximal colon compatible with diarrhea. No bowel obstruction or other complicating features. Electronically Signed   By: Genevie Ann M.D.   On: 01/17/2022 06:44     LOS: 0 days   Antonieta Pert, MD [facility]  01/18/2022, 10:21 AM

## 2022-01-19 LAB — URINALYSIS, ROUTINE W REFLEX MICROSCOPIC
Bacteria, UA: NONE SEEN
Bilirubin Urine: NEGATIVE
Glucose, UA: NEGATIVE mg/dL
Hgb urine dipstick: NEGATIVE
Ketones, ur: 20 mg/dL — AB
Leukocytes,Ua: NEGATIVE
Nitrite: NEGATIVE
Protein, ur: NEGATIVE mg/dL
Specific Gravity, Urine: 1.024 (ref 1.005–1.030)
pH: 5 (ref 5.0–8.0)

## 2022-01-19 LAB — BASIC METABOLIC PANEL
Anion gap: 7 (ref 5–15)
BUN: 10 mg/dL (ref 6–20)
CO2: 17 mmol/L — ABNORMAL LOW (ref 22–32)
Calcium: 6.9 mg/dL — ABNORMAL LOW (ref 8.9–10.3)
Chloride: 103 mmol/L (ref 98–111)
Creatinine, Ser: 0.77 mg/dL (ref 0.61–1.24)
GFR, Estimated: >60 mL/min (ref >60)
Glucose, Bld: 112 mg/dL — ABNORMAL HIGH (ref 70–99)
Potassium: 3.6 mmol/L (ref 3.5–5.1)
Sodium: 127 mmol/L — ABNORMAL LOW (ref 135–145)

## 2022-01-19 LAB — SODIUM, URINE, RANDOM: Sodium, Ur: 45 mmol/L

## 2022-01-19 MED ORDER — PANTOPRAZOLE SODIUM 40 MG PO TBEC
40.0000 mg | DELAYED_RELEASE_TABLET | Freq: Every day | ORAL | Status: DC
  Administered 2022-01-19 – 2022-01-20 (×2): 40 mg via ORAL
  Filled 2022-01-19 (×2): qty 1

## 2022-01-19 MED ORDER — SODIUM CHLORIDE 1 G PO TABS
1.0000 g | ORAL_TABLET | Freq: Three times a day (TID) | ORAL | Status: DC
  Administered 2022-01-19 – 2022-01-20 (×4): 1 g via ORAL
  Filled 2022-01-19 (×4): qty 1

## 2022-01-19 NOTE — Progress Notes (Signed)
   01/18/22 2109  Assess: MEWS Score  Temp 98.5 F (36.9 C)  BP 124/76  MAP (mmHg) 90  Pulse Rate (!) 110  Resp (!) 24  SpO2 98 %  O2 Device Room Air  Assess: MEWS Score  MEWS Temp 0  MEWS Systolic 0  MEWS Pulse 1  MEWS RR 1  MEWS LOC 0  MEWS Score 2  MEWS Score Color Yellow  Assess: if the MEWS score is Yellow or Red  Were vital signs taken at a resting state? Yes  Focused Assessment Change from prior assessment (see assessment flowsheet)  Does the patient meet 2 or more of the SIRS criteria? Yes  Does the patient have a confirmed or suspected source of infection? Yes  Provider and Rapid Response Notified? Yes  MEWS guidelines implemented *See Row Information* Yes  Treat  MEWS Interventions Other (Comment) (no new orders)  Take Vital Signs  Increase Vital Sign Frequency  Yellow: Q 2hr X 2 then Q 4hr X 2, if remains yellow, continue Q 4hrs  Escalate  MEWS: Escalate Yellow: discuss with charge nurse/RN and consider discussing with provider and RRT  Notify: Charge Nurse/RN  Name of Charge Nurse/RN Notified T Kester Stimpson  Date Charge Nurse/RN Notified 01/18/22  Time Charge Nurse/RN Notified 2115  Provider Notification  Provider Name/Title Virgel Manifold  Date Provider Notified 01/18/22  Time Provider Notified 2120  Method of Notification Page  Notification Reason Change in status  Provider response No new orders  Date of Provider Response 01/18/22  Time of Provider Response 2120  Document  Patient Outcome Other (Comment) (stable)  Progress note created (see row info) Yes  Assess: SIRS CRITERIA  SIRS Temperature  0  SIRS Pulse 1  SIRS Respirations  1  SIRS WBC 1  SIRS Score Sum  3

## 2022-01-19 NOTE — Progress Notes (Signed)
PROGRESS NOTE Billy Pace  XLK:[id number] DOB: September 26, 1980 DOA: 01/17/2022 PCP: Jackie Plum, MD   Brief Narrative/Hospital Course: 41 year old male with history of asthma and recent multiple hospitalizations: Initially for paronychia needing I&D on 11/8 and discharged home on Bactrim, readmitted 01/11/22 with nausea vomiting poor intake dehydration mouth sores-no other skin lesion low concern for SJS, AKI dehydration hyponatremia and discharged 11/16, presents again with 3 to 4 days of nausea vomiting, diarrhea, poor intake.  In ED: Initially tachycardic 114 BP 100s, labs with hyponatremia 130>126, 11 low 1.8 LFT ALT 68 leukocytosis 12.2/thrombocytosis.  Underwent CT abdomen w/ "Diffusely inflamed, nondilated small bowel loops with mucosal hyperenhancement. Relative sparing of the terminal ileum. Favor Acute Infectious Enteritis. Fluid distended stomach, and some fluid in the proximal colon compatible with diarrhea" patient appeared dry dehydrated given 3 L RL, C. difficile GI panel ordered and admission requested for further management C. difficile came back negative. On admit-Had urine retention needing in and out cath   Subjective: Seen and examined this morning.  She had a loose stool diarrhea after getting in and out cath this morning otherwise no nausea vomiting , mild abdominal pain present Overnight no fever, again had  800 cc on bladder scan needing in and out cath x 1 Labs reviewed sodium downtrending 127 Tolerating 50-75% of meal  Assessment and Plan: Principal Problem:   Acute gastroenteritis Active Problems:   Hyponatremia   Leucocytosis   Transaminitis   Diarrhea   Gastroenteritis   Acute gastroenteritis with nausea vomiting diarrhea: Recent multiple hospitalization has had 6 days of antibiotics for paronychia that had resolved.  Negative for significant GIP, off enteric precaution.  Tolerating p.o overall, continue current diet, discontinued IV fluids, continue  PPI.   Hypovolemic hyponatremia Dehydration: Sodium initially improved to 129 now downtrending despite IV fluid hydration question SIADH check urine studies hold IV fluids since he is taking p.o., add salt tablets repeat labs in the morning  Recent Labs  Lab 01/17/22 0404 01/18/22 0534 01/19/22 0557  NA 126* 129* 127*   Urine retention: needing I and out cath> advised ambulation, DC IV fluids and monitor. Leukocytosis: Unclear etiology likely in the setting of #1/dehydration> reactive, resolved.  He is afebrile.  Monitor Mildly elevated ALT.  Monitor Asthma on as needed albuterol.  Not in exacerbation Lip/mouth lesions -scabbed and healing. Class I obesity with BMI 30.8: Will benefit with weight loss healthy lifestyle  DVT prophylaxis: enoxaparin (LOVENOX) injection 40 mg Start: 01/17/22 1000 Code Status:   Code Status: Full Code Family Communication: plan of care discussed with patient at bedside. Patient status is: Inpatient because of acute gastroenteritis and hyponatremia needing IV fluids Level of care: Med-Surg   Dispo: The patient is from: Home            Anticipated disposition: Home in next 24 hours if sodium remains improved and tolerating diet Objective: Vitals last 24 hrs: Vitals:   01/18/22 2301 01/19/22 0106 01/19/22 0506 01/19/22 0902  BP: 123/72 134/82 128/70 130/75  Pulse: (!) 105 (!) 113 (!) 120 (!) 109  Resp: (!) 26 20 (!) 24 16  Temp: 98.4 F (36.9 C) 97.7 F (36.5 C) 99.1 F (37.3 C) 99.3 F (37.4 C)  TempSrc: Oral Oral Oral Oral  SpO2: 98% 97% 95% 97%  Weight:      Height:       Weight change:   Physical Examination: General exam: alert awake oriented, pleasant, older than stated age HEENT:Oral mucosa moist, Ear/Nose WNL grossly  Respiratory system: Bilaterally clear BS, no use of accessory muscle Cardiovascular system: S1 & S2 +, No JVD. Gastrointestinal system: Abdomen soft, obese mildly tender mid abdomen ,ND, BS+ Nervous System: Alert,  awake, moving extremities, he follows commands. Extremities: LE edema neg,distal peripheral pulses palpable.  Skin: No rashes,no icterus. MSK: Normal muscle bulk,tone, power   Medications reviewed:  Scheduled Meds:  enoxaparin (LOVENOX) injection  40 mg Subcutaneous Q24H   pantoprazole  40 mg Oral Daily   sodium chloride  1 g Oral TID WC   Continuous Infusions:  methocarbamol (ROBAXIN) IV      Diet Order             Diet regular Room service appropriate? Yes; Fluid consistency: Thin  Diet effective now                   Intake/Output Summary (Last 24 hours) at 01/19/2022 1245 Last data filed at 01/19/2022 0905 Gross per 24 hour  Intake 2864.06 ml  Output 851 ml  Net 2013.06 ml   Net IO Since Admission: 7,592.43 mL [01/19/22 1245]  Wt Readings from Last 3 Encounters:  01/17/22 108 kg  01/12/22 108.9 kg  01/04/22 108.9 kg     Unresulted Labs (From admission, onward)     Start     Ordered   01/24/22 0500  Creatinine, serum  (enoxaparin (LOVENOX)    CrCl >/= 30 ml/min)  Weekly,   R     Comments: while on enoxaparin therapy    01/17/22 0841   01/19/22 0853  Sodium, urine, random  Add-on,   AD        01/19/22 0853   01/19/22 0853  Osmolality, urine  Add-on,   AD        01/19/22 0853   01/19/22 0656  Urinalysis, Routine w reflex microscopic  Once,   R        01/19/22 0655   01/19/22 0500  Basic metabolic panel  Daily at 5am,   R      01/18/22 1021          Data Reviewed: I have personally reviewed following labs and imaging studies CBC: Recent Labs  Lab 01/17/22 0404 01/18/22 0534  WBC 12.2* 9.1  NEUTROABS 7.2  --   HGB 18.3* 15.0  HCT 53.4* 43.9  MCV 87.4 87.8  PLT 608* 563*   Basic Metabolic Panel: Recent Labs  Lab 01/17/22 0404 01/18/22 0534 01/19/22 0557  NA 126* 129* 127*  K 4.2 4.1 3.6  CL 99 103 103  CO2 17* 21* 17*  GLUCOSE 153* 108* 112*  BUN 17 15 10   CREATININE 0.94 0.72 0.77  CALCIUM 7.3* 6.9* 6.9*   GFR: Estimated  Creatinine Clearance: 162.2 mL/min (by C-G formula based on SCr of 0.77 mg/dL). Liver Function Tests: Recent Labs  Lab 01/17/22 0404 01/18/22 0534  AST 36 22  ALT 68* 30  ALKPHOS 53 33*  BILITOT 0.9 0.7  PROT 4.7* 3.9*  ALBUMIN 1.8* <1.5*   Recent Labs  Lab 01/17/22 0404  LIPASE 71*   No results for input(s): "AMMONIA" in the last 168 hours. Coagulation Profile: No results for input(s): "INR", "PROTIME" in the last 168 hours. BNP (last 3 results) No results for input(s): "PROBNP" in the last 8760 hours. HbA1C: No results for input(s): "HGBA1C" in the last 72 hours. CBG: No results for input(s): "GLUCAP" in the last 168 hours. Lipid Profile: No results for input(s): "CHOL", "HDL", "LDLCALC", "TRIG", "CHOLHDL", "LDLDIRECT" in the  last 72 hours. Thyroid Function Tests: No results for input(s): "TSH", "T4TOTAL", "FREET4", "T3FREE", "THYROIDAB" in the last 72 hours. Sepsis Labs: Recent Labs  Lab 01/17/22 0404  LATICACIDVEN 1.3    Recent Results (from the past 240 hour(s))  Resp Panel by RT-PCR (Flu A&B, Covid) Anterior Nasal Swab     Status: None   Collection Time: 01/10/22  5:57 PM   Specimen: Anterior Nasal Swab  Result Value Ref Range Status   SARS Coronavirus 2 by RT PCR NEGATIVE NEGATIVE Final    Comment: (NOTE) SARS-CoV-2 target nucleic acids are NOT DETECTED.  The SARS-CoV-2 RNA is generally detectable in upper respiratory specimens during the acute phase of infection. The lowest concentration of SARS-CoV-2 viral copies this assay can detect is 138 copies/mL. A negative result does not preclude SARS-Cov-2 infection and should not be used as the sole basis for treatment or other patient management decisions. A negative result may occur with  improper specimen collection/handling, submission of specimen other than nasopharyngeal swab, presence of viral mutation(s) within the areas targeted by this assay, and inadequate number of viral copies(<138 copies/mL). A  negative result must be combined with clinical observations, patient history, and epidemiological information. The expected result is Negative.  Fact Sheet for Patients:  BloggerCourse.com  Fact Sheet for Healthcare Providers:  SeriousBroker.it  This test is no t yet approved or cleared by the [place] FDA and  has been authorized for detection and/or diagnosis of SARS-CoV-2 by FDA under an Emergency Use Authorization (EUA). This EUA will remain  in effect (meaning this test can be used) for the duration of the COVID-19 declaration under Section 564(b)(1) of the Act, 21 U.S.C.section 360bbb-3(b)(1), unless the authorization is terminated  or revoked sooner.       Influenza A by PCR NEGATIVE NEGATIVE Final   Influenza B by PCR NEGATIVE NEGATIVE Final    Comment: (NOTE) The Xpert Xpress SARS-CoV-2/FLU/RSV plus assay is intended as an aid in the diagnosis of influenza from Nasopharyngeal swab specimens and should not be used as a sole basis for treatment. Nasal washings and aspirates are unacceptable for Xpert Xpress SARS-CoV-2/FLU/RSV testing.  Fact Sheet for Patients: BloggerCourse.com  Fact Sheet for Healthcare Providers: SeriousBroker.it  This test is not yet approved or cleared by the [place] FDA and has been authorized for detection and/or diagnosis of SARS-CoV-2 by FDA under an Emergency Use Authorization (EUA). This EUA will remain in effect (meaning this test can be used) for the duration of the COVID-19 declaration under Section 564(b)(1) of the Act, 21 U.S.C. section 360bbb-3(b)(1), unless the authorization is terminated or revoked.  Performed at [facility], [address], [place], [place] [address]   Blood culture (routine x 2)     Status: Abnormal   Collection Time: 01/10/22  5:57 PM   Specimen: BLOOD  Result Value Ref Range Status    Specimen Description BLOOD RIGHT ANTECUBITAL  Final   Special Requests   Final    BOTTLES DRAWN AEROBIC AND ANAEROBIC Blood Culture results may not be optimal due to an inadequate volume of blood received in culture bottles   Culture  Setup Time   Final    GRAM POSITIVE COCCI IN CLUSTERS BOTTLES DRAWN AEROBIC ONLY CRITICAL RESULT CALLED TO, READ BACK BY AND VERIFIED WITH: RN K. MOON 01/13/22 @ 0417 BY AB    Culture (A)  Final    STAPHYLOCOCCUS HOMINIS THE SIGNIFICANCE OF ISOLATING THIS ORGANISM FROM A SINGLE SET OF BLOOD CULTURES  WHEN MULTIPLE SETS ARE DRAWN IS UNCERTAIN. PLEASE NOTIFY THE MICROBIOLOGY DEPARTMENT WITHIN ONE WEEK IF SPECIATION AND SENSITIVITIES ARE REQUIRED. Performed at [facility], [address] [place], [place] [address]    Report Status 01/14/2022 FINAL  Final  Blood Culture ID Panel (Reflexed)     Status: Abnormal   Collection Time: 01/10/22  5:57 PM  Result Value Ref Range Status   Enterococcus faecalis NOT DETECTED NOT DETECTED Final   Enterococcus Faecium NOT DETECTED NOT DETECTED Final   Listeria monocytogenes NOT DETECTED NOT DETECTED Final   Staphylococcus species DETECTED (A) NOT DETECTED Final    Comment: CRITICAL RESULT CALLED TO, READ BACK BY AND VERIFIED WITH: RN K. MOON 01/13/22 @ 0417 BY AB    Staphylococcus aureus (BCID) NOT DETECTED NOT DETECTED Final   Staphylococcus epidermidis NOT DETECTED NOT DETECTED Final   Staphylococcus lugdunensis NOT DETECTED NOT DETECTED Final   Streptococcus species NOT DETECTED NOT DETECTED Final   Streptococcus agalactiae NOT DETECTED NOT DETECTED Final   Streptococcus pneumoniae NOT DETECTED NOT DETECTED Final   Streptococcus pyogenes NOT DETECTED NOT DETECTED Final   A.calcoaceticus-baumannii NOT DETECTED NOT DETECTED Final   Bacteroides fragilis NOT DETECTED NOT DETECTED Final   Enterobacterales NOT DETECTED NOT DETECTED Final   Enterobacter cloacae complex NOT DETECTED NOT DETECTED Final   Escherichia  coli NOT DETECTED NOT DETECTED Final   Klebsiella aerogenes NOT DETECTED NOT DETECTED Final   Klebsiella oxytoca NOT DETECTED NOT DETECTED Final   Klebsiella pneumoniae NOT DETECTED NOT DETECTED Final   Proteus species NOT DETECTED NOT DETECTED Final   Salmonella species NOT DETECTED NOT DETECTED Final   Serratia marcescens NOT DETECTED NOT DETECTED Final   Haemophilus influenzae NOT DETECTED NOT DETECTED Final   Neisseria meningitidis NOT DETECTED NOT DETECTED Final   Pseudomonas aeruginosa NOT DETECTED NOT DETECTED Final   Stenotrophomonas maltophilia NOT DETECTED NOT DETECTED Final   Candida albicans NOT DETECTED NOT DETECTED Final   Candida auris NOT DETECTED NOT DETECTED Final   Candida glabrata NOT DETECTED NOT DETECTED Final   Candida krusei NOT DETECTED NOT DETECTED Final   Candida parapsilosis NOT DETECTED NOT DETECTED Final   Candida tropicalis NOT DETECTED NOT DETECTED Final   Cryptococcus neoformans/gattii NOT DETECTED NOT DETECTED Final    Comment: Performed at [facility], [address] [place], [place] [address]  Blood culture (routine x 2)     Status: None   Collection Time: 01/11/22  6:40 AM   Specimen: BLOOD  Result Value Ref Range Status   Specimen Description BLOOD SITE NOT SPECIFIED  Final   Special Requests   Final    BOTTLES DRAWN AEROBIC AND ANAEROBIC Blood Culture adequate volume   Culture   Final    NO GROWTH 5 DAYS Performed at [facility], [address] [place], [place] [address]    Report Status 01/16/2022 FINAL  Final  Group A Strep by PCR     Status: None   Collection Time: 01/11/22  6:40 AM   Specimen: Throat; Sterile Swab  Result Value Ref Range Status   Group A Strep by PCR NOT DETECTED NOT DETECTED Final    Comment: Performed at [facility], [address] [place], [place] [address]  C Difficile Quick Screen w PCR reflex     Status: None   Collection Time: 01/17/22  5:56 PM   Specimen: STOOL  Result Value Ref Range  Status   C Diff antigen  NEGATIVE NEGATIVE Final   C Diff toxin NEGATIVE NEGATIVE Final   C Diff interpretation No C. difficile detected.  Final    Comment: Performed at [facility], [address] [place], [place] [address]  Gastrointestinal Panel by PCR , Stool     Status: None   Collection Time: 01/17/22  5:56 PM   Specimen: STOOL  Result Value Ref Range Status   Campylobacter species NOT DETECTED NOT DETECTED Final   Plesimonas shigelloides NOT DETECTED NOT DETECTED Final   Salmonella species NOT DETECTED NOT DETECTED Final   Yersinia enterocolitica NOT DETECTED NOT DETECTED Final   Vibrio species NOT DETECTED NOT DETECTED Final   Vibrio cholerae NOT DETECTED NOT DETECTED Final   Enteroaggregative E coli (EAEC) NOT DETECTED NOT DETECTED Final   Enteropathogenic E coli (EPEC) NOT DETECTED NOT DETECTED Final   Enterotoxigenic E coli (ETEC) NOT DETECTED NOT DETECTED Final   Shiga like toxin producing E coli (STEC) NOT DETECTED NOT DETECTED Final   Shigella/Enteroinvasive E coli (EIEC) NOT DETECTED NOT DETECTED Final   Cryptosporidium NOT DETECTED NOT DETECTED Final   Cyclospora cayetanensis NOT DETECTED NOT DETECTED Final   Entamoeba histolytica NOT DETECTED NOT DETECTED Final   Giardia lamblia NOT DETECTED NOT DETECTED Final   Adenovirus F40/41 NOT DETECTED NOT DETECTED Final   Astrovirus NOT DETECTED NOT DETECTED Final   Norovirus GI/GII NOT DETECTED NOT DETECTED Final   Rotavirus A NOT DETECTED NOT DETECTED Final   Sapovirus (I, II, IV, and V) NOT DETECTED NOT DETECTED Final    Comment: Performed at [facility], [address] [place], [place] [address]    Antimicrobials: Anti-infectives (From admission, onward)    None      Culture/Microbiology    Component Value Date/Time   SDES BLOOD SITE NOT SPECIFIED 01/11/2022 0640   SPECREQUEST  01/11/2022 0640    BOTTLES DRAWN AEROBIC AND ANAEROBIC Blood Culture adequate volume   CULT  01/11/2022  0640    NO GROWTH 5 DAYS Performed at [facility], [address], [place], [place] [address]    REPTSTATUS 01/16/2022 FINAL 01/11/2022 8882  Radiology Studies: No results found.   LOS: 1 day   Lanae Boast, MD [facility]  01/19/2022, 12:45 PM

## 2022-01-19 NOTE — Progress Notes (Signed)
Bladder scan 800 cc at 0600(last void  2100)        In & Out cath 850cc at [facility]

## 2022-01-20 LAB — BASIC METABOLIC PANEL
Anion gap: 8 (ref 5–15)
BUN: 9 mg/dL (ref 6–20)
CO2: 19 mmol/L — ABNORMAL LOW (ref 22–32)
Calcium: 6.8 mg/dL — ABNORMAL LOW (ref 8.9–10.3)
Chloride: 101 mmol/L (ref 98–111)
Creatinine, Ser: 0.52 mg/dL — ABNORMAL LOW (ref 0.61–1.24)
GFR, Estimated: >60 mL/min (ref >60)
Glucose, Bld: 103 mg/dL — ABNORMAL HIGH (ref 70–99)
Potassium: 3.3 mmol/L — ABNORMAL LOW (ref 3.5–5.1)
Sodium: 128 mmol/L — ABNORMAL LOW (ref 135–145)

## 2022-01-20 LAB — OSMOLALITY, URINE: Osmolality, Ur: 807 mOsm/kg (ref 300–900)

## 2022-01-20 MED ORDER — SODIUM CHLORIDE 1 G PO TABS: 1.0000 g | ORAL_TABLET | Freq: Three times a day (TID) | ORAL | 0 refills | Status: AC

## 2022-01-20 NOTE — Plan of Care (Signed)

## 2022-01-20 NOTE — Discharge Summary (Signed)
Physician Discharge Summary   Billy Pace [id number] DOB: 01/28/1981 DOA: 01/17/2022  PCP: Benito Mccreedy, MD  Admit date: 01/17/2022 Discharge date: 01/20/2022  Barriers to discharge: none  Admitted From: Home Disposition:  Home Discharging physician: Dwyane Dee, MD  Recommendations for Outpatient Follow-up:  Continue routine care  Home Health:  Equipment/Devices:   Discharge Condition: stable CODE STATUS: Full Diet recommendation:  Diet Orders (From admission, onward)     Start     Ordered   01/20/22 0000  Diet general        01/20/22 0943   01/18/22 1110  Diet regular Room service appropriate? Yes; Fluid consistency: Thin  Diet effective now       Question Answer Comment  Room service appropriate? Yes   Fluid consistency: Thin      01/18/22 1109            Hospital Course:  Acute gastroenteritis with nausea vomiting diarrhea - resolved  Recent multiple hospitalization has had 6 days of antibiotics for paronychia that had resolved.  Negative for significant GIP, off enteric precaution.  Tolerating p.o overall, continue current diet, discontinued IV fluids, continue PPI.  - no further issues prior to discharge; tolerated diet well   Hypovolemic hyponatremia Dehydration Sodium initially improved to 129 then downtrended despite IV fluid hydration  - some improvement with salt tabs - continue NaCl tabs at discharge x 2 days while oral intake improves further   Urine retention: s/p I&O cath; retention resolved and patient voiding well prior to discharge  Leukocytosis: Unclear etiology likely in the setting of #1/dehydration> reactive, resolved.  He is afebrile.   Mildly elevated ALT.  Resolved  Asthma on as needed albuterol.  Not in exacerbation Lip/mouth lesions -scabbed and healing. Class I obesity with BMI 30.8: Will benefit with weight loss healthy lifestyle   The patient's chronic medical conditions were treated accordingly per the  patient's home medication regimen except as noted.  On day of discharge, patient was felt deemed stable for discharge. Patient/family member advised to call PCP or come back to ER if needed.   Principal Diagnosis: Acute gastroenteritis  Discharge Diagnoses: Active Hospital Problems   Diagnosis Date Noted   Acute gastroenteritis 01/17/2022   Gastroenteritis 01/18/2022   Hyponatremia 01/17/2022   Leucocytosis 01/17/2022   Transaminitis 01/17/2022   Diarrhea 01/17/2022    Resolved Hospital Problems  No resolved problems to display.     Discharge Instructions     Diet general   Complete by: As directed    Increase activity slowly   Complete by: As directed       Allergies as of 01/20/2022       Reactions   Bactrim [sulfamethoxazole-trimethoprim] Nausea And Vomiting, Rash   Patient had N/V a few hours after taking Bactrim that worsened after continued use of the drug. He also developed a dry, tender rash localized to his mouth.         Medication List     STOP taking these medications    albuterol 108 (90 Base) MCG/ACT inhaler Commonly known as: VENTOLIN HFA   ibuprofen 600 MG tablet Commonly known as: ADVIL       TAKE these medications    acetaminophen 500 MG tablet Commonly known as: TYLENOL Take 1,000 mg by mouth every 6 (six) hours as needed for moderate pain.   multivitamin with minerals tablet Take 1 tablet by mouth daily.   sodium chloride 1 g tablet Take 1 tablet (1 g total) by  mouth 3 (three) times daily with meals for 2 days.   TUMS PO Take 2-3 tablets by mouth daily as needed (heartburn).        Follow-up Information     Osei-Bonsu, Iona Beard, MD. Schedule an appointment as soon as possible for a visit in 1 week(s).   Specialty: Internal Medicine Why: Repeat BMP to check sodium level Contact information: [id number] [place] [address] [phone]                Allergies  Allergen Reactions   Bactrim  [Sulfamethoxazole-Trimethoprim] Nausea And Vomiting and Rash    Patient had N/V a few hours after taking Bactrim that worsened after continued use of the drug. He also developed a dry, tender rash localized to his mouth.     Consultations:   Procedures:   Discharge Exam: BP 130/77 (BP Location: Right Arm)   Pulse 99   Temp 98 F (36.7 C) (Oral)   Resp 18   Ht 6\' 2"  (1.88 m)   Wt 108 kg   SpO2 98%   BMI 30.57 kg/m  Physical Exam Constitutional:      General: He is not in acute distress.    Appearance: Normal appearance.  HENT:     Head: Normocephalic and atraumatic.     Mouth/Throat:     Mouth: Mucous membranes are moist.  Eyes:     Extraocular Movements: Extraocular movements intact.  Cardiovascular:     Rate and Rhythm: Normal rate and regular rhythm.     Heart sounds: Normal heart sounds.  Pulmonary:     Effort: Pulmonary effort is normal. No respiratory distress.     Breath sounds: Normal breath sounds. No wheezing.  Abdominal:     General: Bowel sounds are normal. There is no distension.     Palpations: Abdomen is soft.     Tenderness: There is no abdominal tenderness.  Musculoskeletal:        General: Normal range of motion.     Cervical back: Normal range of motion and neck supple.  Skin:    General: Skin is warm and dry.  Neurological:     General: No focal deficit present.     Mental Status: He is alert.  Psychiatric:        Mood and Affect: Mood normal.        Behavior: Behavior normal.      The results of significant diagnostics from this hospitalization (including imaging, microbiology, ancillary and laboratory) are listed below for reference.   Microbiology: Recent Results (from the past 240 hour(s))  Resp Panel by RT-PCR (Flu A&B, Covid) Anterior Nasal Swab     Status: None   Collection Time: 01/10/22  5:57 PM   Specimen: Anterior Nasal Swab  Result Value Ref Range Status   SARS Coronavirus 2 by RT PCR NEGATIVE NEGATIVE Final    Comment:  (NOTE) SARS-CoV-2 target nucleic acids are NOT DETECTED.  The SARS-CoV-2 RNA is generally detectable in upper respiratory specimens during the acute phase of infection. The lowest concentration of SARS-CoV-2 viral copies this assay can detect is 138 copies/mL. A negative result does not preclude SARS-Cov-2 infection and should not be used as the sole basis for treatment or other patient management decisions. A negative result may occur with  improper specimen collection/handling, submission of specimen other than nasopharyngeal swab, presence of viral mutation(s) within the areas targeted by this assay, and inadequate number of viral copies(<138 copies/mL). A negative result must  be combined with clinical observations, patient history, and epidemiological information. The expected result is Negative.  Fact Sheet for Patients:  EntrepreneurPulse.com.au  Fact Sheet for Healthcare Providers:  IncredibleEmployment.be  This test is no t yet approved or cleared by the [place] FDA and  has been authorized for detection and/or diagnosis of SARS-CoV-2 by FDA under an Emergency Use Authorization (EUA). This EUA will remain  in effect (meaning this test can be used) for the duration of the COVID-19 declaration under Section 564(b)(1) of the Act, 21 U.S.C.section 360bbb-3(b)(1), unless the authorization is terminated  or revoked sooner.       Influenza A by PCR NEGATIVE NEGATIVE Final   Influenza B by PCR NEGATIVE NEGATIVE Final    Comment: (NOTE) The Xpert Xpress SARS-CoV-2/FLU/RSV plus assay is intended as an aid in the diagnosis of influenza from Nasopharyngeal swab specimens and should not be used as a sole basis for treatment. Nasal washings and aspirates are unacceptable for Xpert Xpress SARS-CoV-2/FLU/RSV testing.  Fact Sheet for Patients: EntrepreneurPulse.com.au  Fact Sheet for Healthcare  Providers: IncredibleEmployment.be  This test is not yet approved or cleared by the [place] FDA and has been authorized for detection and/or diagnosis of SARS-CoV-2 by FDA under an Emergency Use Authorization (EUA). This EUA will remain in effect (meaning this test can be used) for the duration of the COVID-19 declaration under Section 564(b)(1) of the Act, 21 U.S.C. section 360bbb-3(b)(1), unless the authorization is terminated or revoked.  Performed at [facility], [place] [address] [place], [place] [address]   Blood culture (routine x 2)     Status: Abnormal   Collection Time: 01/10/22  5:57 PM   Specimen: BLOOD  Result Value Ref Range Status   Specimen Description BLOOD RIGHT ANTECUBITAL  Final   Special Requests   Final    BOTTLES DRAWN AEROBIC AND ANAEROBIC Blood Culture results may not be optimal due to an inadequate volume of blood received in culture bottles   Culture  Setup Time   Final    GRAM POSITIVE COCCI IN CLUSTERS BOTTLES DRAWN AEROBIC ONLY CRITICAL RESULT CALLED TO, READ BACK BY AND VERIFIED WITH: RN K. MOON 01/13/22 @ 0417 BY AB    Culture (A)  Final    STAPHYLOCOCCUS HOMINIS THE SIGNIFICANCE OF ISOLATING THIS ORGANISM FROM A SINGLE SET OF BLOOD CULTURES WHEN MULTIPLE SETS ARE DRAWN IS UNCERTAIN. PLEASE NOTIFY THE MICROBIOLOGY DEPARTMENT WITHIN ONE WEEK IF SPECIATION AND SENSITIVITIES ARE REQUIRED. Performed at [facility], [place] [address] [place], [place] [address]    Report Status 01/14/2022 FINAL  Final  Blood Culture ID Panel (Reflexed)     Status: Abnormal   Collection Time: 01/10/22  5:57 PM  Result Value Ref Range Status   Enterococcus faecalis NOT DETECTED NOT DETECTED Final   Enterococcus Faecium NOT DETECTED NOT DETECTED Final   Listeria monocytogenes NOT DETECTED NOT DETECTED Final   Staphylococcus species DETECTED (A) NOT DETECTED Final    Comment: CRITICAL RESULT CALLED TO, READ BACK BY AND VERIFIED WITH: RN  K. MOON 01/13/22 @ 0417 BY AB    Staphylococcus aureus (BCID) NOT DETECTED NOT DETECTED Final   Staphylococcus epidermidis NOT DETECTED NOT DETECTED Final   Staphylococcus lugdunensis NOT DETECTED NOT DETECTED Final   Streptococcus species NOT DETECTED NOT DETECTED Final   Streptococcus agalactiae NOT DETECTED NOT DETECTED Final   Streptococcus pneumoniae NOT DETECTED NOT DETECTED Final   Streptococcus pyogenes NOT DETECTED NOT DETECTED Final   A.calcoaceticus-baumannii  NOT DETECTED NOT DETECTED Final   Bacteroides fragilis NOT DETECTED NOT DETECTED Final   Enterobacterales NOT DETECTED NOT DETECTED Final   Enterobacter cloacae complex NOT DETECTED NOT DETECTED Final   Escherichia coli NOT DETECTED NOT DETECTED Final   Klebsiella aerogenes NOT DETECTED NOT DETECTED Final   Klebsiella oxytoca NOT DETECTED NOT DETECTED Final   Klebsiella pneumoniae NOT DETECTED NOT DETECTED Final   Proteus species NOT DETECTED NOT DETECTED Final   Salmonella species NOT DETECTED NOT DETECTED Final   Serratia marcescens NOT DETECTED NOT DETECTED Final   Haemophilus influenzae NOT DETECTED NOT DETECTED Final   Neisseria meningitidis NOT DETECTED NOT DETECTED Final   Pseudomonas aeruginosa NOT DETECTED NOT DETECTED Final   Stenotrophomonas maltophilia NOT DETECTED NOT DETECTED Final   Candida albicans NOT DETECTED NOT DETECTED Final   Candida auris NOT DETECTED NOT DETECTED Final   Candida glabrata NOT DETECTED NOT DETECTED Final   Candida krusei NOT DETECTED NOT DETECTED Final   Candida parapsilosis NOT DETECTED NOT DETECTED Final   Candida tropicalis NOT DETECTED NOT DETECTED Final   Cryptococcus neoformans/gattii NOT DETECTED NOT DETECTED Final    Comment: Performed at [facility], [place] [address], [place], [place] [address]  Blood culture (routine x 2)     Status: None   Collection Time: 01/11/22  6:40 AM   Specimen: BLOOD  Result Value Ref Range Status   Specimen Description BLOOD SITE NOT  SPECIFIED  Final   Special Requests   Final    BOTTLES DRAWN AEROBIC AND ANAEROBIC Blood Culture adequate volume   Culture   Final    NO GROWTH 5 DAYS Performed at [facility], [address], [place], [place] [address]    Report Status 01/16/2022 FINAL  Final  Group A Strep by PCR     Status: None   Collection Time: 01/11/22  6:40 AM   Specimen: Throat; Sterile Swab  Result Value Ref Range Status   Group A Strep by PCR NOT DETECTED NOT DETECTED Final    Comment: Performed at [facility], [place] [address] [place],  [address]  C Difficile Quick Screen w PCR reflex     Status: None   Collection Time: 01/17/22  5:56 PM   Specimen: STOOL  Result Value Ref Range Status   C Diff antigen NEGATIVE NEGATIVE Final   C Diff toxin NEGATIVE NEGATIVE Final   C Diff interpretation No C. difficile detected.  Final    Comment: Performed at [facility], [place] [address] [place],  [address]  Gastrointestinal Panel by PCR , Stool     Status: None   Collection Time: 01/17/22  5:56 PM   Specimen: STOOL  Result Value Ref Range Status   Campylobacter species NOT DETECTED NOT DETECTED Final   Plesimonas shigelloides NOT DETECTED NOT DETECTED Final   Salmonella species NOT DETECTED NOT DETECTED Final   Yersinia enterocolitica NOT DETECTED NOT DETECTED Final   Vibrio species NOT DETECTED NOT DETECTED Final   Vibrio cholerae NOT DETECTED NOT DETECTED Final   Enteroaggregative E coli (EAEC) NOT DETECTED NOT DETECTED Final   Enteropathogenic E coli (EPEC) NOT DETECTED NOT DETECTED Final   Enterotoxigenic E coli (ETEC) NOT DETECTED NOT DETECTED Final   Shiga like toxin producing E coli (STEC) NOT DETECTED NOT DETECTED Final   Shigella/Enteroinvasive E coli (EIEC) NOT DETECTED NOT DETECTED Final   Cryptosporidium NOT DETECTED NOT DETECTED Final   Cyclospora cayetanensis NOT DETECTED  NOT DETECTED Final   Entamoeba histolytica NOT DETECTED NOT DETECTED Final    Giardia lamblia NOT DETECTED NOT DETECTED Final   Adenovirus F40/41 NOT DETECTED NOT DETECTED Final   Astrovirus NOT DETECTED NOT DETECTED Final   Norovirus GI/GII NOT DETECTED NOT DETECTED Final   Rotavirus A NOT DETECTED NOT DETECTED Final   Sapovirus (I, II, IV, and V) NOT DETECTED NOT DETECTED Final    Comment: Performed at [facility], [address] [place], [place] [address]     Labs: BNP (last 3 results) No results for input(s): "BNP" in the last 8760 hours. Basic Metabolic Panel: Recent Labs  Lab 01/17/22 0404 01/18/22 0534 01/19/22 0557 01/20/22 0512  NA 126* 129* 127* 128*  K 4.2 4.1 3.6 3.3*  CL 99 103 103 101  CO2 17* 21* 17* 19*  GLUCOSE 153* 108* 112* 103*  BUN 17 15 10 9   CREATININE 0.94 0.72 0.77 0.52*  CALCIUM 7.3* 6.9* 6.9* 6.8*   Liver Function Tests: Recent Labs  Lab 01/17/22 0404 01/18/22 0534  AST 36 22  ALT 68* 30  ALKPHOS 53 33*  BILITOT 0.9 0.7  PROT 4.7* 3.9*  ALBUMIN 1.8* <1.5*   Recent Labs  Lab 01/17/22 0404  LIPASE 71*   No results for input(s): "AMMONIA" in the last 168 hours. CBC: Recent Labs  Lab 01/17/22 0404 01/18/22 0534  WBC 12.2* 9.1  NEUTROABS 7.2  --   HGB 18.3* 15.0  HCT 53.4* 43.9  MCV 87.4 87.8  PLT 608* 563*   Cardiac Enzymes: No results for input(s): "CKTOTAL", "CKMB", "CKMBINDEX", "TROPONINI" in the last 168 hours. BNP: Invalid input(s): "POCBNP" CBG: No results for input(s): "GLUCAP" in the last 168 hours. D-Dimer No results for input(s): "DDIMER" in the last 72 hours. Hgb A1c No results for input(s): "HGBA1C" in the last 72 hours. Lipid Profile No results for input(s): "CHOL", "HDL", "LDLCALC", "TRIG", "CHOLHDL", "LDLDIRECT" in the last 72 hours. Thyroid function studies No results for input(s): "TSH", "T4TOTAL", "T3FREE", "THYROIDAB" in the last 72 hours.  Invalid input(s): "FREET3" Anemia work up No results for input(s): "VITAMINB12", "FOLATE", "FERRITIN", "TIBC", "IRON",  "RETICCTPCT" in the last 72 hours. Urinalysis    Component Value Date/Time   COLORURINE YELLOW 01/19/2022 2132   APPEARANCEUR CLEAR 01/19/2022 2132   LABSPEC 1.024 01/19/2022 2132   PHURINE 5.0 01/19/2022 2132   GLUCOSEU NEGATIVE 01/19/2022 2132   HGBUR NEGATIVE 01/19/2022 2132   BILIRUBINUR NEGATIVE 01/19/2022 2132   KETONESUR 20 (A) 01/19/2022 2132   PROTEINUR NEGATIVE 01/19/2022 2132   NITRITE NEGATIVE 01/19/2022 2132   LEUKOCYTESUR NEGATIVE 01/19/2022 2132   Sepsis Labs Recent Labs  Lab 01/17/22 0404 01/18/22 0534  WBC 12.2* 9.1   Microbiology Recent Results (from the past 240 hour(s))  Resp Panel by RT-PCR (Flu A&B, Covid) Anterior Nasal Swab     Status: None   Collection Time: 01/10/22  5:57 PM   Specimen: Anterior Nasal Swab  Result Value Ref Range Status   SARS Coronavirus 2 by RT PCR NEGATIVE NEGATIVE Final    Comment: (NOTE) SARS-CoV-2 target nucleic acids are NOT DETECTED.  The SARS-CoV-2 RNA is generally detectable in upper respiratory specimens during the acute phase of infection. The lowest concentration of SARS-CoV-2 viral copies this assay can detect is 138 copies/mL. A negative result does not preclude SARS-Cov-2 infection and should not be used as the sole basis for treatment or other patient management decisions. A negative result may occur with  improper specimen collection/handling, submission  of specimen other than nasopharyngeal swab, presence of viral mutation(s) within the areas targeted by this assay, and inadequate number of viral copies(<138 copies/mL). A negative result must be combined with clinical observations, patient history, and epidemiological information. The expected result is Negative.  Fact Sheet for Patients:  BloggerCourse.com  Fact Sheet for Healthcare Providers:  SeriousBroker.it  This test is no t yet approved or cleared by the [place] FDA and  has been authorized  for detection and/or diagnosis of SARS-CoV-2 by FDA under an Emergency Use Authorization (EUA). This EUA will remain  in effect (meaning this test can be used) for the duration of the COVID-19 declaration under Section 564(b)(1) of the Act, 21 U.S.C.section 360bbb-3(b)(1), unless the authorization is terminated  or revoked sooner.       Influenza A by PCR NEGATIVE NEGATIVE Final   Influenza B by PCR NEGATIVE NEGATIVE Final    Comment: (NOTE) The Xpert Xpress SARS-CoV-2/FLU/RSV plus assay is intended as an aid in the diagnosis of influenza from Nasopharyngeal swab specimens and should not be used as a sole basis for treatment. Nasal washings and aspirates are unacceptable for Xpert Xpress SARS-CoV-2/FLU/RSV testing.  Fact Sheet for Patients: BloggerCourse.com  Fact Sheet for Healthcare Providers: SeriousBroker.it  This test is not yet approved or cleared by the [place] FDA and has been authorized for detection and/or diagnosis of SARS-CoV-2 by FDA under an Emergency Use Authorization (EUA). This EUA will remain in effect (meaning this test can be used) for the duration of the COVID-19 declaration under Section 564(b)(1) of the Act, 21 U.S.C. section 360bbb-3(b)(1), unless the authorization is terminated or revoked.  Performed at [facility], [address], [place], [place] [address]   Blood culture (routine x 2)     Status: Abnormal   Collection Time: 01/10/22  5:57 PM   Specimen: BLOOD  Result Value Ref Range Status   Specimen Description BLOOD RIGHT ANTECUBITAL  Final   Special Requests   Final    BOTTLES DRAWN AEROBIC AND ANAEROBIC Blood Culture results may not be optimal due to an inadequate volume of blood received in culture bottles   Culture  Setup Time   Final    GRAM POSITIVE COCCI IN CLUSTERS BOTTLES DRAWN AEROBIC ONLY CRITICAL RESULT CALLED TO, READ BACK BY AND VERIFIED WITH: RN K. MOON 01/13/22 @  0417 BY AB    Culture (A)  Final    STAPHYLOCOCCUS HOMINIS THE SIGNIFICANCE OF ISOLATING THIS ORGANISM FROM A SINGLE SET OF BLOOD CULTURES WHEN MULTIPLE SETS ARE DRAWN IS UNCERTAIN. PLEASE NOTIFY THE MICROBIOLOGY DEPARTMENT WITHIN ONE WEEK IF SPECIATION AND SENSITIVITIES ARE REQUIRED. Performed at [facility], [address] [place], [place] [address]    Report Status 01/14/2022 FINAL  Final  Blood Culture ID Panel (Reflexed)     Status: Abnormal   Collection Time: 01/10/22  5:57 PM  Result Value Ref Range Status   Enterococcus faecalis NOT DETECTED NOT DETECTED Final   Enterococcus Faecium NOT DETECTED NOT DETECTED Final   Listeria monocytogenes NOT DETECTED NOT DETECTED Final   Staphylococcus species DETECTED (A) NOT DETECTED Final    Comment: CRITICAL RESULT CALLED TO, READ BACK BY AND VERIFIED WITH: RN K. MOON 01/13/22 @ 0417 BY AB    Staphylococcus aureus (BCID) NOT DETECTED NOT DETECTED Final   Staphylococcus epidermidis NOT DETECTED NOT DETECTED Final   Staphylococcus lugdunensis NOT DETECTED NOT DETECTED Final   Streptococcus species NOT DETECTED NOT DETECTED Final  Streptococcus agalactiae NOT DETECTED NOT DETECTED Final   Streptococcus pneumoniae NOT DETECTED NOT DETECTED Final   Streptococcus pyogenes NOT DETECTED NOT DETECTED Final   A.calcoaceticus-baumannii NOT DETECTED NOT DETECTED Final   Bacteroides fragilis NOT DETECTED NOT DETECTED Final   Enterobacterales NOT DETECTED NOT DETECTED Final   Enterobacter cloacae complex NOT DETECTED NOT DETECTED Final   Escherichia coli NOT DETECTED NOT DETECTED Final   Klebsiella aerogenes NOT DETECTED NOT DETECTED Final   Klebsiella oxytoca NOT DETECTED NOT DETECTED Final   Klebsiella pneumoniae NOT DETECTED NOT DETECTED Final   Proteus species NOT DETECTED NOT DETECTED Final   Salmonella species NOT DETECTED NOT DETECTED Final   Serratia marcescens NOT DETECTED NOT DETECTED Final   Haemophilus influenzae NOT DETECTED  NOT DETECTED Final   Neisseria meningitidis NOT DETECTED NOT DETECTED Final   Pseudomonas aeruginosa NOT DETECTED NOT DETECTED Final   Stenotrophomonas maltophilia NOT DETECTED NOT DETECTED Final   Candida albicans NOT DETECTED NOT DETECTED Final   Candida auris NOT DETECTED NOT DETECTED Final   Candida glabrata NOT DETECTED NOT DETECTED Final   Candida krusei NOT DETECTED NOT DETECTED Final   Candida parapsilosis NOT DETECTED NOT DETECTED Final   Candida tropicalis NOT DETECTED NOT DETECTED Final   Cryptococcus neoformans/gattii NOT DETECTED NOT DETECTED Final    Comment: Performed at [facility], [address] [place], [place] [address]  Blood culture (routine x 2)     Status: None   Collection Time: 01/11/22  6:40 AM   Specimen: BLOOD  Result Value Ref Range Status   Specimen Description BLOOD SITE NOT SPECIFIED  Final   Special Requests   Final    BOTTLES DRAWN AEROBIC AND ANAEROBIC Blood Culture adequate volume   Culture   Final    NO GROWTH 5 DAYS Performed at [facility], [address] [place], [place] [address]    Report Status 01/16/2022 FINAL  Final  Group A Strep by PCR     Status: None   Collection Time: 01/11/22  6:40 AM   Specimen: Throat; Sterile Swab  Result Value Ref Range Status   Group A Strep by PCR NOT DETECTED NOT DETECTED Final    Comment: Performed at [facility], [address], [place], [place] [address]  C Difficile Quick Screen w PCR reflex     Status: None   Collection Time: 01/17/22  5:56 PM   Specimen: STOOL  Result Value Ref Range Status   C Diff antigen NEGATIVE NEGATIVE Final   C Diff toxin NEGATIVE NEGATIVE Final   C Diff interpretation No C. difficile detected.  Final    Comment: Performed at [facility], [address] [place], [place] [address]  Gastrointestinal Panel by PCR , Stool     Status: None   Collection Time: 01/17/22  5:56 PM   Specimen: STOOL  Result Value Ref Range Status    Campylobacter species NOT DETECTED NOT DETECTED Final   Plesimonas shigelloides NOT DETECTED NOT DETECTED Final   Salmonella species NOT DETECTED NOT DETECTED Final   Yersinia enterocolitica NOT DETECTED NOT DETECTED Final   Vibrio species NOT DETECTED NOT DETECTED Final   Vibrio cholerae NOT DETECTED NOT DETECTED Final   Enteroaggregative E coli (EAEC) NOT DETECTED NOT DETECTED Final   Enteropathogenic E coli (EPEC) NOT DETECTED NOT DETECTED Final   Enterotoxigenic E coli (ETEC) NOT DETECTED NOT DETECTED Final   Shiga like toxin producing E coli (STEC) NOT  DETECTED NOT DETECTED Final   Shigella/Enteroinvasive E coli (EIEC) NOT DETECTED NOT DETECTED Final   Cryptosporidium NOT DETECTED NOT DETECTED Final   Cyclospora cayetanensis NOT DETECTED NOT DETECTED Final   Entamoeba histolytica NOT DETECTED NOT DETECTED Final   Giardia lamblia NOT DETECTED NOT DETECTED Final   Adenovirus F40/41 NOT DETECTED NOT DETECTED Final   Astrovirus NOT DETECTED NOT DETECTED Final   Norovirus GI/GII NOT DETECTED NOT DETECTED Final   Rotavirus A NOT DETECTED NOT DETECTED Final   Sapovirus (I, II, IV, and V) NOT DETECTED NOT DETECTED Final    Comment: Performed at [facility], [address], [place], [place] [address]    Procedures/Studies: CT ABDOMEN PELVIS W CONTRAST  Result Date: 01/17/2022 CLINICAL DATA:  41 year old male with abdominal pain and diarrhea for 3-4 days. EXAM: CT ABDOMEN AND PELVIS WITH CONTRAST TECHNIQUE: Multidetector CT imaging of the abdomen and pelvis was performed using the standard protocol following bolus administration of intravenous contrast. RADIATION DOSE REDUCTION: This exam was performed according to the departmental dose-optimization program which includes automated exposure control, adjustment of the mA and/or kV according to patient size and/or use of iterative reconstruction technique. CONTRAST:  197mL OMNIPAQUE IOHEXOL 300 MG/ML  SOLN COMPARISON:  None Available.  FINDINGS: Lower chest: Negative. Hepatobiliary: Negative liver and gallbladder. Pancreas: Negative. Spleen: Negative. Adrenals/Urinary Tract: Negative, aside from moderately distended urinary bladder. With mucosal Stomach/Bowel: Negative large bowel from the splenic flexure distally. Fluid containing, nondilated right colon and transverse colon. Evidence of prior appendectomy at the tip of the cecum series 2, image 69. No convincing large bowel inflammation. However, small bowel loops appear diffusely inflamed with mucosal hyperenhancement and wall thickening (series 2, image 61 and coronal image 69). Jejunum appears somewhat more affected than the ileum. And the terminal ileum appears relatively spared. Stomach is fluid distended. Duodenum is decompressed and also appears mildly inflamed. No free air or free fluid. No convincing mesenteric inflammation. Vascular/Lymphatic: Major arterial structures and portal venous system appear patent and within normal limits. No lymphadenopathy identified. Reproductive: Negative. Other: No pelvic free fluid. Musculoskeletal: Negative. IMPRESSION: Diffusely inflamed, nondilated small bowel loops with mucosal hyperenhancement. Relative sparing of the terminal ileum. Favor Acute Infectious Enteritis. Fluid distended stomach, and some fluid in the proximal colon compatible with diarrhea. No bowel obstruction or other complicating features. Electronically Signed   By: Genevie Ann M.D.   On: 01/17/2022 06:44   DG Chest 2 View  Result Date: 01/11/2022 CLINICAL DATA:  Sepsis and vomiting. EXAM: CHEST - 2 VIEW COMPARISON:  Portable chest 09/13/2018 FINDINGS: The heart size and mediastinal contours are within normal limits. Both lungs are hypoinflated with atelectatic bands in the lower zones but no convincing pneumonia. The visualized skeletal structures are unremarkable. IMPRESSION: Hypoinflated lungs with atelectatic bands in the lower zones but no convincing pneumonia. Follow-up  study in full inspiration is suggested. Electronically Signed   By: Telford Nab M.D.   On: 01/11/2022 07:38   DG Finger Index Left  Result Date: 01/10/2022 CLINICAL DATA:  Infection to left index finger x1 week. EXAM: LEFT INDEX FINGER 2+V COMPARISON:  None Available. FINDINGS: There is no evidence of fracture or dislocation. No cortical break or periosteal reaction identified. There is no evidence of arthropathy or other focal bone abnormality. No radiopaque foreign body. IMPRESSION: No radiographic evidence of osteomyelitis or radiopaque foreign body. Electronically Signed   By: Dahlia Bailiff M.D.   On: 01/10/2022 18:30     Time coordinating discharge: Over  30 minutes    Dwyane Dee, MD  [facility] 01/20/2022, 1:55 PM

## 2022-01-21 ENCOUNTER — Emergency Department (HOSPITAL_COMMUNITY)
Admission: EM | Admit: 2022-01-21 | Discharge: 2022-01-21 | Disposition: A | Discharge status: Home / Self Care | Payer: Medicaid Other | Attending: Emergency Medicine | Admitting: Emergency Medicine

## 2022-01-21 ENCOUNTER — Other Ambulatory Visit: Payer: Self-pay

## 2022-01-21 ENCOUNTER — Encounter (HOSPITAL_COMMUNITY): Payer: Self-pay

## 2022-01-21 DIAGNOSIS — E871 Hypo-osmolality and hyponatremia: Secondary | ICD-10-CM | POA: Insufficient documentation

## 2022-01-21 DIAGNOSIS — R197 Diarrhea, unspecified: Secondary | ICD-10-CM

## 2022-01-21 DIAGNOSIS — R339 Retention of urine, unspecified: Secondary | ICD-10-CM | POA: Insufficient documentation

## 2022-01-21 LAB — BASIC METABOLIC PANEL
Anion gap: 9 (ref 5–15)
BUN: 11 mg/dL (ref 6–20)
CO2: 17 mmol/L — ABNORMAL LOW (ref 22–32)
Calcium: 7.1 mg/dL — ABNORMAL LOW (ref 8.9–10.3)
Chloride: 100 mmol/L (ref 98–111)
Creatinine, Ser: 0.76 mg/dL (ref 0.61–1.24)
GFR, Estimated: >60 mL/min (ref >60)
Glucose, Bld: 174 mg/dL — ABNORMAL HIGH (ref 70–99)
Potassium: 3.9 mmol/L (ref 3.5–5.1)
Sodium: 126 mmol/L — ABNORMAL LOW (ref 135–145)

## 2022-01-21 LAB — URINALYSIS, ROUTINE W REFLEX MICROSCOPIC
Bilirubin Urine: NEGATIVE
Glucose, UA: NEGATIVE mg/dL
Hgb urine dipstick: NEGATIVE
Ketones, ur: 20 mg/dL — AB
Leukocytes,Ua: NEGATIVE
Nitrite: NEGATIVE
Protein, ur: NEGATIVE mg/dL
Specific Gravity, Urine: 1.014 (ref 1.005–1.030)
pH: 6 (ref 5.0–8.0)

## 2022-01-21 LAB — CBC WITH DIFFERENTIAL/PLATELET
Abs Immature Granulocytes: 0.07 10*3/uL (ref 0.00–0.07)
Basophils Absolute: 0.1 10*3/uL (ref 0.0–0.1)
Basophils Relative: 1 %
Eosinophils Absolute: 0 10*3/uL (ref 0.0–0.5)
Eosinophils Relative: 0 %
HCT: 44.5 % (ref 39.0–52.0)
Hemoglobin: 15.4 g/dL (ref 13.0–17.0)
Immature Granulocytes: 1 %
Lymphocytes Relative: 29 %
Lymphs Abs: 2 10*3/uL (ref 0.7–4.0)
MCH: 30.2 pg (ref 26.0–34.0)
MCHC: 34.6 g/dL (ref 30.0–36.0)
MCV: 87.3 fL (ref 80.0–100.0)
Monocytes Absolute: 0.7 10*3/uL (ref 0.1–1.0)
Monocytes Relative: 10 %
Neutro Abs: 4.1 10*3/uL (ref 1.7–7.7)
Neutrophils Relative %: 59 %
Platelets: 621 10*3/uL — ABNORMAL HIGH (ref 150–400)
RBC: 5.1 MIL/uL (ref 4.22–5.81)
RDW: 12.6 % (ref 11.5–15.5)
WBC: 7 10*3/uL (ref 4.0–10.5)
nRBC: 0 % (ref 0.0–0.2)

## 2022-01-21 MED ORDER — LIDOCAINE HCL URETHRAL/MUCOSAL 2 % EX GEL
1.0000 | Freq: Once | CUTANEOUS | Status: AC
  Administered 2022-01-21: 1 via URETHRAL
  Filled 2022-01-21: qty 11

## 2022-01-21 NOTE — ED Triage Notes (Signed)
Patient reports that he voided a small amount around 400 today and midnight the time before.  Paiaeant has SOB and HR- 128

## 2022-01-21 NOTE — ED Notes (Signed)
Patient educated on how to change the urinary drainage bags at home.  He is now up in the bathroom.

## 2022-01-21 NOTE — ED Notes (Signed)
Patient unable to sit up due to weakness.  Will inform MD of the same

## 2022-01-21 NOTE — ED Provider Notes (Signed)
[facility]-EMERGENCY DEPT Provider Note   CSN: [id number] Arrival date & time: 01/21/22  1629     History  Chief Complaint  Patient presents with   Urinary Retention    Billy Pace is a 41 y.o. male here presenting with urinary retention.  Patient was recently admitted to the hospital for gastroenteritis and hyponatremia.  Patient was found to be in urinary tension at that time.  Patient had multiple In-N-Out cath at that time but was able to urinate so was discharged home without the catheter.  Patient was discharged yesterday and states that he was unable to urinate since last night.  He states that he has been having suprapubic tenderness.  The history is provided by the patient.       Home Medications Prior to Admission medications   Medication Sig Start Date End Date Taking? Authorizing Provider  acetaminophen (TYLENOL) 500 MG tablet Take 1,000 mg by mouth every 6 (six) hours as needed for moderate pain.    [provider]  Calcium Carbonate Antacid (TUMS PO) Take 2-3 tablets by mouth daily as needed (heartburn).    [provider]  Multiple Vitamins-Minerals (MULTIVITAMIN WITH MINERALS) tablet Take 1 tablet by mouth daily.    [provider]  sodium chloride 1 g tablet Take 1 tablet (1 g total) by mouth 3 (three) times daily with meals for 2 days. 01/20/22 01/22/22  Lewie Chamber, MD  loratadine (CLARITIN) 10 MG tablet Take 1 tablet (10 mg total) by mouth daily. Patient not taking: Reported on 07/30/2017 05/30/16 03/18/20  Loletta Specter, PA-C      Allergies    Bactrim [sulfamethoxazole-trimethoprim]    Review of Systems   Review of Systems  Genitourinary:  Positive for difficulty urinating.       Suprapubic pain  All other systems reviewed and are negative.   Physical Exam Updated Vital Signs BP 126/86 (BP Location: Left Arm)   Pulse (!) 128   Temp 97.8 F (36.6 C) (Oral)   Resp 20   Ht 6\' 2"  (1.88 m)   Wt  108 kg   SpO2 98%   BMI 30.57 kg/m  Physical Exam Vitals and nursing note reviewed.  Constitutional:      Comments: Uncomfortable  HENT:     Head: Normocephalic.     Mouth/Throat:     Mouth: Mucous membranes are moist.  Eyes:     Extraocular Movements: Extraocular movements intact.     Pupils: Pupils are equal, round, and reactive to light.  Cardiovascular:     Rate and Rhythm: Normal rate and regular rhythm.  Pulmonary:     Effort: Pulmonary effort is normal.     Breath sounds: Normal breath sounds.  Abdominal:     Comments: + suprapubic tenderness   Musculoskeletal:        General: Normal range of motion.     Cervical back: Normal range of motion and neck supple.  Skin:    General: Skin is warm.     Capillary Refill: Capillary refill takes less than 2 seconds.  Neurological:     General: No focal deficit present.     Mental Status: He is alert and oriented to person, place, and time.  Psychiatric:        Mood and Affect: Mood normal.        Behavior: Behavior normal.     ED Results / Procedures / Treatments   Labs (all labs ordered are listed, but only  abnormal results are displayed) Labs Reviewed  CBC WITH DIFFERENTIAL/PLATELET - Abnormal; Notable for the following components:      Result Value   Platelets 621 (*)    All other components within normal limits  BASIC METABOLIC PANEL - Abnormal; Notable for the following components:   Sodium 126 (*)    CO2 17 (*)    Glucose, Bld 174 (*)    Calcium 7.1 (*)    All other components within normal limits  URINE CULTURE  URINALYSIS, ROUTINE W REFLEX MICROSCOPIC    EKG None  Radiology No results found.  Procedures Procedures    Medications Ordered in ED Medications  lidocaine (XYLOCAINE) 2 % jelly 1 Application (has no administration in time range)    ED Course/ Medical Decision Making/ A&P                           Medical Decision Making Billy Pace is a 41 y.o. male here with urinary retention.  Patient has bladder scan that showed over 400 and his bladder.  Will recheck CBC and BMP and also place Foley catheter.  Patient required in and out catheter when he was in the hospital.  Will refer to urology outpatient. Of note, he had CT ab/pel 4 days that showed no renal mass or bladder mass  8:12 PM Reviewed patient's labs and his sodium is 126.  When he was admitted, it ranges from 126 to 128.  Patient has leg edema already.  Foley was placed and about 1 L came out.  Urinalysis did not show any infection.  I told him that he will need to have the Foley in place and need to call urology on Monday for follow-up   Problems Addressed: Hyponatremia: acute illness or injury Urinary retention: acute illness or injury  Amount and/or Complexity of Data Reviewed Labs: ordered. Decision-making details documented in ED Course. Radiology: ordered and independent interpretation performed. Decision-making details documented in ED Course.    Final Clinical Impression(s) / ED Diagnoses Final diagnoses:  None    Rx / DC Orders ED Discharge Orders     None         Charlynne Pander, MD 01/21/22 2014

## 2022-01-21 NOTE — Discharge Instructions (Addendum)
You have urinary retention.  As we discussed, you need to have a Foley in place until you see urology.  Please call urology office on Monday for follow-up.  Please drain the Foley catheter when it gets full.  Take imodium as needed for diarrhea, up to 10 times a day   See your doctor for follow up   Return to ER if you have a lot of blood in the catheter, catheter not draining, severe pain.

## 2022-01-21 NOTE — ED Notes (Signed)
Indwelling catheter continues to drain.  Patient aware of plan to teach him how to change from the drainage bag to the leg bag.  He is to follow up with his MD.

## 2022-01-21 NOTE — ED Notes (Signed)
Patient is ready to discharge.  He verbalized understanding of discharge instructions with addition of immodium.  He has been given an additional drainage bag along with his leg bag for home use.  He was also given gloves and alcohol pads.

## 2022-01-21 NOTE — ED Provider Triage Note (Signed)
Emergency Medicine Provider Triage Evaluation Note  Billy Pace , a 41 y.o. male  was evaluated in triage.  Pt complains of urinary retention. Recent dc for dehydration, hyponatremia, urinary retention. Dc yesterday. Has been unable to urinate today. No pain, swelling to GU region. No hematuria.  Review of Systems  Positive: Urinary retention Negative:   Physical Exam  BP 126/86 (BP Location: Left Arm)   Pulse (!) 131   Temp 97.8 F (36.6 C) (Oral)   Resp 20   SpO2 98%  Gen:   Awake, no distress   Resp:  Normal effort  MSK:   Moves extremities without difficulty  Other:    Medical Decision Making  Medically screening exam initiated at 4:42 PM.  Appropriate orders placed.  Orlie Dakin Gowell was informed that the remainder of the evaluation will be completed by another provider, this initial triage assessment does not replace that evaluation, and the importance of remaining in the ED until their evaluation is complete.  Urinary retention   Gretta Samons A, PA-C 01/21/22 1644

## 2022-01-21 NOTE — ED Notes (Signed)
Patient verbalized understanding of discharge instructions and reasons to return to the ED 

## 2022-01-23 ENCOUNTER — Telehealth (HOSPITAL_COMMUNITY): Payer: Self-pay | Admitting: Emergency Medicine

## 2022-01-23 LAB — URINE CULTURE: Culture: NO GROWTH

## 2022-01-23 NOTE — Telephone Encounter (Signed)
Patient requested his referral for urology placed to [facility].  Order placed in epic and patient informed that this is an external referral and patient will need to call to arrange an appointment.

## 2022-08-23 ENCOUNTER — Encounter (HOSPITAL_COMMUNITY): Payer: Self-pay | Admitting: Emergency Medicine

## 2022-08-23 ENCOUNTER — Emergency Department (HOSPITAL_COMMUNITY): Payer: Medicaid Other

## 2022-08-23 ENCOUNTER — Emergency Department (HOSPITAL_COMMUNITY)
Admission: EM | Admit: 2022-08-23 | Discharge: 2022-08-23 | Disposition: A | Discharge status: Home / Self Care | Payer: Medicaid Other | Attending: Emergency Medicine | Admitting: Emergency Medicine

## 2022-08-23 ENCOUNTER — Other Ambulatory Visit: Payer: Self-pay

## 2022-08-23 DIAGNOSIS — Z8616 Personal history of COVID-19: Secondary | ICD-10-CM | POA: Insufficient documentation

## 2022-08-23 DIAGNOSIS — R0602 Shortness of breath: Secondary | ICD-10-CM | POA: Diagnosis present

## 2022-08-23 LAB — CBC WITH DIFFERENTIAL/PLATELET
Abs Immature Granulocytes: 0.01 10*3/uL (ref 0.00–0.07)
Basophils Absolute: 0 10*3/uL (ref 0.0–0.1)
Basophils Relative: 0 %
Eosinophils Absolute: 0.4 10*3/uL (ref 0.0–0.5)
Eosinophils Relative: 6 %
HCT: 44.6 % (ref 39.0–52.0)
Hemoglobin: 14.8 g/dL (ref 13.0–17.0)
Immature Granulocytes: 0 %
Lymphocytes Relative: 39 %
Lymphs Abs: 2.9 10*3/uL (ref 0.7–4.0)
MCH: 29.1 pg (ref 26.0–34.0)
MCHC: 33.2 g/dL (ref 30.0–36.0)
MCV: 87.6 fL (ref 80.0–100.0)
Monocytes Absolute: 0.6 10*3/uL (ref 0.1–1.0)
Monocytes Relative: 9 %
Neutro Abs: 3.4 10*3/uL (ref 1.7–7.7)
Neutrophils Relative %: 46 %
Platelets: 303 10*3/uL (ref 150–400)
RBC: 5.09 MIL/uL (ref 4.22–5.81)
RDW: 13 % (ref 11.5–15.5)
WBC: 7.4 10*3/uL (ref 4.0–10.5)
nRBC: 0 % (ref 0.0–0.2)

## 2022-08-23 LAB — COMPREHENSIVE METABOLIC PANEL
ALT: 39 U/L (ref 0–44)
AST: 39 U/L (ref 15–41)
Albumin: 3.6 g/dL (ref 3.5–5.0)
Alkaline Phosphatase: 75 U/L (ref 38–126)
Anion gap: 8 (ref 5–15)
BUN: 8 mg/dL (ref 6–20)
CO2: 22 mmol/L (ref 22–32)
Calcium: 9.1 mg/dL (ref 8.9–10.3)
Chloride: 106 mmol/L (ref 98–111)
Creatinine, Ser: 0.99 mg/dL (ref 0.61–1.24)
GFR, Estimated: >60 mL/min (ref >60)
Glucose, Bld: 90 mg/dL (ref 70–99)
Potassium: 3.7 mmol/L (ref 3.5–5.1)
Sodium: 136 mmol/L (ref 135–145)
Total Bilirubin: 0.8 mg/dL (ref 0.3–1.2)
Total Protein: 7.4 g/dL (ref 6.5–8.1)

## 2022-08-23 LAB — TROPONIN I (HIGH SENSITIVITY): Troponin I (High Sensitivity): 3 ng/L (ref <18)

## 2022-08-23 MED ORDER — ALBUTEROL SULFATE HFA 108 (90 BASE) MCG/ACT IN AERS: 1.0000 | INHALATION_SPRAY | Freq: Four times a day (QID) | RESPIRATORY_TRACT | 0 refills | Status: AC | PRN

## 2022-08-23 MED ORDER — ALBUTEROL SULFATE HFA 108 (90 BASE) MCG/ACT IN AERS
2.0000 | INHALATION_SPRAY | Freq: Once | RESPIRATORY_TRACT | Status: AC
  Administered 2022-08-23: 2 via RESPIRATORY_TRACT
  Filled 2022-08-23: qty 6.7

## 2022-08-23 NOTE — ED Triage Notes (Signed)
Patient arrives ambulatory by POV states at home over past two days felt like it was really stuffy in the house and felt like he was having trouble breathing. Used his inhaler with improvement but used the last of it. Patient speaking in full sentences. C/o mild cough.

## 2022-08-23 NOTE — Progress Notes (Signed)
Visited with patient to provided support.  Pt said he was having shortness of Breathe. Chaplain provided listening,emotional and Spiritual support.  Chaplain available as needed.  Venida Jarvis, [place], [facility], Pager [phone]

## 2022-08-23 NOTE — ED Provider Notes (Signed)
[place] EMERGENCY DEPARTMENT AT [facility] Provider Note   CSN: [id number] Arrival date & time: 08/23/22  4098     History  Chief Complaint  Patient presents with   Asthma    Billy Pace is a 42 y.o. male, pertinent past medical history, who presents to the ED secondary to shortness of breath for the last 3 days.  He states that a couple days ago he noticed that his house was stuck.  Than usual, and he started becoming short of breath.  He states that he feels a little bit of chest congestion, and some shortness of breath.  Shortness of breath is not worse when walking, laying, or doing any certain activities.  Denies any associated chest pain, nausea, vomiting.  States that he had an albuterol from COVID a few years back, used it and he had some relief, but is now out of it.  No history of asthma, or tobacco use.     Home Medications Prior to Admission medications   Medication Sig Start Date End Date Taking? Authorizing Provider  albuterol (VENTOLIN HFA) 108 (90 Base) MCG/ACT inhaler Inhale 1-2 puffs into the lungs every 6 (six) hours as needed for wheezing or shortness of breath. 08/23/22  Yes Javel Hersh L, PA  acetaminophen (TYLENOL) 500 MG tablet Take 1,000 mg by mouth every 6 (six) hours as needed for moderate pain.    [provider]  Calcium Carbonate Antacid (TUMS PO) Take 2-3 tablets by mouth daily as needed (heartburn).    [provider]  Multiple Vitamins-Minerals (MULTIVITAMIN WITH MINERALS) tablet Take 1 tablet by mouth daily.    [provider]  loratadine (CLARITIN) 10 MG tablet Take 1 tablet (10 mg total) by mouth daily. Patient not taking: Reported on 07/30/2017 05/30/16 03/18/20  Loletta Specter, PA-C      Allergies    Bactrim [sulfamethoxazole-trimethoprim]    Review of Systems   Review of Systems  Respiratory:  Positive for shortness of breath. Negative for cough.     Physical Exam Updated Vital Signs BP 117/77    Pulse 67   Temp 98.8 F (37.1 C) (Oral)   Resp 20   Ht 6\' 2"  (1.88 m)   Wt 108 kg   SpO2 97%   BMI 30.56 kg/m  Physical Exam Vitals and nursing note reviewed.  Constitutional:      General: He is not in acute distress.    Appearance: He is well-developed.  HENT:     Head: Normocephalic and atraumatic.  Eyes:     Conjunctiva/sclera: Conjunctivae normal.  Cardiovascular:     Rate and Rhythm: Normal rate and regular rhythm.     Heart sounds: No murmur heard. Pulmonary:     Effort: Pulmonary effort is normal. No respiratory distress.     Breath sounds: Normal breath sounds.  Abdominal:     Palpations: Abdomen is soft.     Tenderness: There is no abdominal tenderness.  Musculoskeletal:        General: No swelling.     Cervical back: Neck supple.  Skin:    General: Skin is warm and dry.     Capillary Refill: Capillary refill takes less than 2 seconds.  Neurological:     Mental Status: He is alert.  Psychiatric:        Mood and Affect: Mood normal.     ED Results / Procedures / Treatments   Labs (all labs ordered are listed, but only abnormal results  are displayed) Labs Reviewed  CBC WITH DIFFERENTIAL/PLATELET  COMPREHENSIVE METABOLIC PANEL  TROPONIN I (HIGH SENSITIVITY)  TROPONIN I (HIGH SENSITIVITY)    EKG EKG Interpretation  Date/Time:  Wednesday August 23 2022 10:35:00 EDT Ventricular Rate:  66 PR Interval:  185 QRS Duration: 97 QT Interval:  360 QTC Calculation: 378 R Axis:   22 Text Interpretation: Sinus rhythm Posterior infarct, old Borderline T abnormalities, inferior leads new compared to prior EKG Confirmed by Elayne Snare ([id number]) on 08/23/2022 11:41:26 AM  Radiology DG Chest 2 View  Result Date: 08/23/2022 CLINICAL DATA:  42 year old male with shortness of breath. EXAM: CHEST - 2 VIEW COMPARISON:  Chest radiographs 01/11/2022 and earlier. FINDINGS: PA and lateral views at 1054 hours. Low normal lung volumes. Normal cardiac size and mediastinal  contours. Visualized tracheal air column is within normal limits. Both lungs appear clear. No pneumothorax or pleural effusion. Negative visible bowel gas and osseous structures. IMPRESSION: Negative.  No cardiopulmonary abnormality. Electronically Signed   By: Odessa Fleming M.D.   On: 08/23/2022 10:56    Procedures Procedures    Medications Ordered in ED Medications  albuterol (VENTOLIN HFA) 108 (90 Base) MCG/ACT inhaler 2 puff (2 puffs Inhalation Given 08/23/22 1036)    ED Course/ Medical Decision Making/ A&P                             Medical Decision Making Patient is a 42 year old male, here for shortness of breath has been going on for the last couple days since has been hot outside.  Will obtain troponins, EKG for further evaluation.  He denies any chest pain.  His lungs sound clear.  Given that he has had relief with albuterol we will give him another 5.  Amount and/or Complexity of Data Reviewed Labs: ordered.    Details: Troponin within normal limits Radiology: ordered.    Details: Chest x-ray clear ECG/medicine tests: ordered. Discussion of management or test interpretation with external provider(s): Discussed with patient, well-appearing, feeling better after albuterol.  He does have clear lung sounds thus I think it is unlikely that he has an asthmatic, bronchitis process.  Shortness of breath unclear etiology.  He is PERC negative, and heart score is low.  Discharged home with albuterol.  Follow-up with PCP.  Risk Prescription drug management.    Final Clinical Impression(s) / ED Diagnoses Final diagnoses:  Shortness of breath    Rx / DC Orders ED Discharge Orders          Ordered    albuterol (VENTOLIN HFA) 108 (90 Base) MCG/ACT inhaler  Every 6 hours PRN        08/23/22 1244              Lynzy Rawles L, PA 08/23/22 1246    Elayne Snare K, DO 08/23/22 1300

## 2022-08-23 NOTE — Discharge Instructions (Signed)
Please follow-up with your primary care doctor, return to the ER if you have worsening shortness of breath, develop chest pain, or have intractable nausea or vomiting.

## 2022-08-23 NOTE — ED Notes (Signed)
Patient transported to X-ray
